# Patient Record
Sex: Male | Born: 1985 | Race: White | Hispanic: No | Marital: Single | Smoking: Former smoker
Health system: Southern US, Community
[De-identification: ages and names within clinical notes are randomized; demographics above are authoritative.]

## PROBLEM LIST (undated history)

## (undated) DIAGNOSIS — F32A Depression, unspecified: Secondary | ICD-10-CM

## (undated) DIAGNOSIS — F419 Anxiety disorder, unspecified: Secondary | ICD-10-CM

## (undated) DIAGNOSIS — M1990 Unspecified osteoarthritis, unspecified site: Secondary | ICD-10-CM

## (undated) HISTORY — PX: OTHER SURGICAL HISTORY: SHX169

## (undated) HISTORY — PX: COLONOSCOPY: SHX174

## (undated) HISTORY — DX: Unspecified osteoarthritis, unspecified site: M19.90

## (undated) HISTORY — DX: Anxiety disorder, unspecified: F41.9

## (undated) HISTORY — PX: UPPER GASTROINTESTINAL ENDOSCOPY: SHX188

## (undated) HISTORY — DX: Depression, unspecified: F32.A

---
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
## 2006-06-30 ENCOUNTER — Emergency Department (HOSPITAL_COMMUNITY): Admission: EM | Admit: 2006-06-30 | Discharge: 2006-06-30 | Payer: Self-pay | Admitting: Emergency Medicine

## 2009-09-13 ENCOUNTER — Emergency Department (HOSPITAL_COMMUNITY): Admission: EM | Admit: 2009-09-13 | Discharge: 2009-09-13 | Payer: Self-pay | Admitting: Emergency Medicine

## 2010-06-03 ENCOUNTER — Emergency Department (HOSPITAL_COMMUNITY): Admission: EM | Admit: 2010-06-03 | Discharge: 2010-06-03 | Payer: Self-pay | Admitting: Emergency Medicine

## 2010-07-31 ENCOUNTER — Ambulatory Visit (HOSPITAL_COMMUNITY): Admission: RE | Admit: 2010-07-31 | Discharge: 2010-07-31 | Payer: Self-pay | Admitting: Orthopaedic Surgery

## 2011-05-24 ENCOUNTER — Other Ambulatory Visit (HOSPITAL_COMMUNITY): Payer: Self-pay | Admitting: Family Medicine

## 2011-05-24 DIAGNOSIS — R1011 Right upper quadrant pain: Secondary | ICD-10-CM

## 2011-05-25 ENCOUNTER — Ambulatory Visit (HOSPITAL_COMMUNITY)
Admission: RE | Admit: 2011-05-25 | Discharge: 2011-05-25 | Disposition: A | Discharge status: Home / Self Care | Payer: Self-pay | Source: Ambulatory Visit | Attending: Family Medicine | Admitting: Family Medicine

## 2011-05-25 DIAGNOSIS — R1011 Right upper quadrant pain: Secondary | ICD-10-CM | POA: Insufficient documentation

## 2011-11-19 ENCOUNTER — Emergency Department (HOSPITAL_COMMUNITY): Payer: Self-pay

## 2011-11-19 ENCOUNTER — Encounter (HOSPITAL_COMMUNITY): Payer: Self-pay

## 2011-11-19 ENCOUNTER — Emergency Department (HOSPITAL_COMMUNITY)
Admission: EM | Admit: 2011-11-19 | Discharge: 2011-11-19 | Disposition: A | Discharge status: Home / Self Care | Payer: Self-pay | Attending: Emergency Medicine | Admitting: Emergency Medicine

## 2011-11-19 DIAGNOSIS — J069 Acute upper respiratory infection, unspecified: Secondary | ICD-10-CM | POA: Insufficient documentation

## 2011-11-19 DIAGNOSIS — Z7982 Long term (current) use of aspirin: Secondary | ICD-10-CM | POA: Insufficient documentation

## 2011-11-19 DIAGNOSIS — F17200 Nicotine dependence, unspecified, uncomplicated: Secondary | ICD-10-CM | POA: Insufficient documentation

## 2011-11-19 MED ORDER — PROMETHAZINE-CODEINE 6.25-10 MG/5ML PO SYRP: 5.0000 mL | ORAL_SOLUTION | ORAL | Status: AC | PRN

## 2011-11-19 MED ORDER — PROMETHAZINE-CODEINE 6.25-10 MG/5ML PO SYRP
ORAL_SOLUTION | ORAL | Status: DC
Start: 2011-11-19 — End: 2012-12-10

## 2011-11-19 MED ORDER — PSEUDOEPHEDRINE HCL 60 MG PO TABS: 60.0000 mg | ORAL_TABLET | ORAL | Status: AC | PRN

## 2011-11-19 NOTE — ED Provider Notes (Signed)
History     CSN: [id number]  Arrival date & time 11/19/11  1538   First MD Initiated Contact with Patient 11/19/11 1658      Chief Complaint  Patient presents with  . URI    (Consider location/radiation/quality/duration/timing/severity/associated sxs/prior treatment) Patient is a 26 y.o. male presenting with cough. The history is provided by the patient.  Cough This is a new problem. The current episode started yesterday. The problem occurs hourly. The problem has been gradually worsening. The cough is productive of sputum. There has been no fever. Associated symptoms include chills, ear congestion, ear pain, headaches, rhinorrhea, sore throat and myalgias. Pertinent negatives include no chest pain, no shortness of breath and no wheezing. He has tried decongestants for the symptoms. The treatment provided no relief. He is a smoker. His past medical history is significant for bronchitis.    History reviewed. No pertinent past medical history.  History reviewed. No pertinent past surgical history.  No family history on file.  History  Substance Use Topics  . Smoking status: Current Everyday Smoker  . Smokeless tobacco: Not on file  . Alcohol Use: No      Review of Systems  Constitutional: Positive for chills. Negative for activity change.       All ROS Neg except as noted in HPI  HENT: Positive for ear pain, sore throat and rhinorrhea. Negative for nosebleeds and neck pain.   Eyes: Negative for photophobia and discharge.  Respiratory: Positive for cough. Negative for shortness of breath and wheezing.   Cardiovascular: Negative for chest pain and palpitations.  Gastrointestinal: Negative for abdominal pain and blood in stool.  Genitourinary: Negative for dysuria, frequency and hematuria.  Musculoskeletal: Positive for myalgias. Negative for back pain and arthralgias.  Skin: Negative.   Neurological: Positive for headaches. Negative for dizziness, seizures and speech  difficulty.  Psychiatric/Behavioral: Negative for hallucinations and confusion.    Allergies  Review of patient's allergies indicates no known allergies.  Home Medications   Current Outpatient Rx  Name Route Sig Dispense Refill  . ASPIRIN 325 MG PO TABS Oral Take 325 mg by mouth as needed. For pain    . DAYQUIL PO Oral Take 2 capsules by mouth as needed. For cough    . PROMETHAZINE-CODEINE 6.25-10 MG/5ML PO SYRP Oral Take 5 mLs by mouth every 4 (four) hours as needed for cough. 120 mL 0  . PROMETHAZINE-CODEINE 6.25-10 MG/5ML PO SYRP  10ml po at hs 100 mL 0  . PSEUDOEPHEDRINE HCL 60 MG PO TABS Oral Take 1 tablet (60 mg total) by mouth every 4 (four) hours as needed for congestion. 30 tablet 0    BP 139/82  Pulse 99  Temp(Src) 98.5 F (36.9 C) (Oral)  Resp 18  Ht 5\' 11"  (1.803 m)  Wt 175 lb (79.379 kg)  BMI 24.41 kg/m2  SpO2 99%  Physical Exam  Nursing note and vitals reviewed. Constitutional: He is oriented to person, place, and time. He appears well-developed and well-nourished.  Non-toxic appearance.  HENT:  Head: Normocephalic.  Right Ear: Tympanic membrane and external ear normal.  Left Ear: Tympanic membrane and external ear normal.       Nasal congestion.  Eyes: EOM and lids are normal. Pupils are equal, round, and reactive to light.  Neck: Normal range of motion. Neck supple. Carotid bruit is not present.  Cardiovascular: Normal rate, regular rhythm, normal heart sounds, intact distal pulses and normal pulses.   Pulmonary/Chest: Breath sounds normal. No respiratory distress.  Few scattered rhonchi.  Abdominal: Soft. Bowel sounds are normal. There is no tenderness. There is no guarding.  Musculoskeletal: Normal range of motion.  Lymphadenopathy:       Head (right side): No submandibular adenopathy present.       Head (left side): No submandibular adenopathy present.    He has no cervical adenopathy.  Neurological: He is alert and oriented to person, place, and  time. He has normal strength. No cranial nerve deficit or sensory deficit.  Skin: Skin is warm and dry. No rash noted.  Psychiatric: He has a normal mood and affect. His speech is normal.    ED Course  Procedures (including critical care time)  Labs Reviewed - No data to display Dg Chest 2 View  11/19/2011  *RADIOLOGY REPORT*  Clinical Data: Congestion.  Cough.  Chest pressure.  CHEST - 2 VIEW  Comparison: 01/18/2005.  Findings:  Cardiopericardial silhouette within normal limits. Mediastinal contours normal. Trachea midline.  No airspace disease or effusion.  IMPRESSION: No active cardiopulmonary disease.  Original Report Authenticated By: Andreas Newport, M.D.     1. URI (upper respiratory infection)       MDM  I have reviewed nursing notes, vital signs, and all appropriate lab and imaging results for this patient.        Kathie Dike, [place] 11/21/11 2107

## 2011-11-19 NOTE — ED Notes (Signed)
Exam by Mayme Genta before seen by me.

## 2011-11-19 NOTE — ED Notes (Signed)
Pt c/o productive cough with white sputum, generalized body aches, chest pressure,  Ears draining, and nose running since yesterday.  Denies fever.

## 2011-11-21 NOTE — ED Provider Notes (Signed)
Medical screening examination/treatment/procedure(s) were performed by non-physician practitioner and as supervising physician I was immediately available for consultation/collaboration.  Crandall Harvel, MD 11/21/11 2232 

## 2012-01-12 ENCOUNTER — Encounter (HOSPITAL_COMMUNITY): Payer: Self-pay | Admitting: *Deleted

## 2012-01-12 ENCOUNTER — Emergency Department (HOSPITAL_COMMUNITY): Payer: Self-pay

## 2012-01-12 ENCOUNTER — Emergency Department (HOSPITAL_COMMUNITY)
Admission: EM | Admit: 2012-01-12 | Discharge: 2012-01-12 | Disposition: A | Discharge status: Home / Self Care | Payer: Self-pay | Attending: Emergency Medicine | Admitting: Emergency Medicine

## 2012-01-12 DIAGNOSIS — M25529 Pain in unspecified elbow: Secondary | ICD-10-CM | POA: Insufficient documentation

## 2012-01-12 DIAGNOSIS — S5010XA Contusion of unspecified forearm, initial encounter: Secondary | ICD-10-CM | POA: Insufficient documentation

## 2012-01-12 DIAGNOSIS — M7989 Other specified soft tissue disorders: Secondary | ICD-10-CM | POA: Insufficient documentation

## 2012-01-12 DIAGNOSIS — M25429 Effusion, unspecified elbow: Secondary | ICD-10-CM | POA: Insufficient documentation

## 2012-01-12 DIAGNOSIS — IMO0002 Reserved for concepts with insufficient information to code with codable children: Secondary | ICD-10-CM | POA: Insufficient documentation

## 2012-01-12 DIAGNOSIS — S53401A Unspecified sprain of right elbow, initial encounter: Secondary | ICD-10-CM

## 2012-01-12 DIAGNOSIS — F17200 Nicotine dependence, unspecified, uncomplicated: Secondary | ICD-10-CM | POA: Insufficient documentation

## 2012-01-12 DIAGNOSIS — Z79899 Other long term (current) drug therapy: Secondary | ICD-10-CM | POA: Insufficient documentation

## 2012-01-12 DIAGNOSIS — S5011XA Contusion of right forearm, initial encounter: Secondary | ICD-10-CM

## 2012-01-12 DIAGNOSIS — W010XXA Fall on same level from slipping, tripping and stumbling without subsequent striking against object, initial encounter: Secondary | ICD-10-CM | POA: Insufficient documentation

## 2012-01-12 MED ORDER — IBUPROFEN 800 MG PO TABS
800.0000 mg | ORAL_TABLET | Freq: Once | ORAL | Status: AC
  Administered 2012-01-12: 800 mg via ORAL
  Filled 2012-01-12: qty 1

## 2012-01-12 MED ORDER — HYDROCODONE-ACETAMINOPHEN 5-325 MG PO TABS: 2.0000 | ORAL_TABLET | ORAL | Status: AC | PRN

## 2012-01-12 MED ORDER — IBUPROFEN 800 MG PO TABS: 800.0000 mg | ORAL_TABLET | Freq: Once | ORAL | Status: AC

## 2012-01-12 MED ORDER — HYDROCODONE-ACETAMINOPHEN 5-325 MG PO TABS
2.0000 | ORAL_TABLET | Freq: Once | ORAL | Status: AC
  Administered 2012-01-12: 2 via ORAL
  Filled 2012-01-12: qty 2

## 2012-01-12 NOTE — ED Notes (Signed)
Pt reports falling off porch and landing on right arm last night.  Reports pain and edema has increased overnight.  Pt able to open and close hand however reports this increases pain.  Denies numbness or tingling.  Radial pulses palpable.

## 2012-01-12 NOTE — Discharge Instructions (Signed)
Bone Bruise  A bone bruise is a small hidden fracture of the bone. It typically occurs with bones located close to the surface of the skin.  SYMPTOMS  The pain lasts longer than a normal bruise.   The bruised area is difficult to use.   There may be discoloration or swelling of the bruised area.   When a bone bruise is found with injury to the anterior cruciate ligament (in the knee) there is often an increased:   Amount of fluid in the knee   Time the fluid in the knee lasts.   Number of days until you are walking normally and regaining the motion you had before the injury.   Number of days with pain from the injury.  DIAGNOSIS  It can only be seen on X-rays known as MRIs. This stands for magnetic resonance imaging. A regular X-ray taken of a bone bruise would appear to be normal. A bone bruise is a common injury in the knee and the heel bone (calcaneus). The problems are similar to those produced by stress fractures, which are bone injuries caused by overuse. A bone bruise may also be a sign of other injuries. For example, bone bruises are commonly found where an anterior cruciate ligament (ACL) in the knee has been pulled away from the bone (ruptured). A ligament is a tough fibrous material that connects bones together to make our joints stable. Bruises of the bone last a lot longer than bruises of the muscle or tissues beneath the skin. Bone bruises can last from days to months and are often more severe and painful than other bruises. TREATMENT Because bone bruises are sudden injuries you cannot often prevent them, other than by being extremely careful. Some things you can do to improve the condition are:  Apply ice to the sore area for 15 to 20 minutes, 3 to 4 times per day while awake for the first 2 days. Put the ice in a plastic bag, and place a towel between the bag of ice and your skin.   Keep your bruised area raised (elevated) when possible to lessen swelling.   For activity:     Use crutches when necessary; do not put weight on the injured leg until you are no longer tender.   You may walk on your affected part as the pain allows, or as instructed.   Start weight bearing gradually on the bruised part.   Continue to use crutches or a cane until you can stand without causing pain, or as instructed.   If a plaster splint was applied, wear the splint until you are seen for a follow-up examination. Rest it on nothing harder than a pillow the first 24 hours. Do not put weight on it. Do not get it wet. You may take it off to take a shower or bath.   If an air splint was applied, more air may be blown into or out of the splint as needed for comfort. You may take it off at night and to take a shower or bath.   Wiggle your toes in the splint several times per day if you are able.   You may have been given an elastic bandage to use with the plaster splint or alone. The splint is too tight if you have numbness, tingling or if your foot becomes cold and blue. Adjust the bandage to make it comfortable.   Only take over-the-counter or prescription medicines for pain, discomfort, or fever as directed by   your caregiver.   Follow all instructions for follow up with your caregiver. This includes any orthopedic referrals, physical therapy, and rehabilitation. Any delay in obtaining necessary care could result in a delay or failure of the bones to heal.  SEEK MEDICAL CARE IF:   You have an increase in bruising, swelling, or pain.   You notice coldness of your toes.   You do not get pain relief with medications.  SEEK IMMEDIATE MEDICAL CARE IF:   Your toes are numb or blue.   You have severe pain not controlled with medications.   If any of the problems that caused you to seek care are becoming worse.  Document Released: 12/29/2003 Document Revised: 09/27/2011 Document Reviewed: 05/12/2008 [facility] Patient Information 2012 [place], [place].Contusion A contusion is a deep  bruise. Contusions are the result of an injury that caused bleeding under the skin. The contusion may turn blue, purple, or yellow. Minor injuries will give you a painless contusion, but more severe contusions may stay painful and swollen for a few weeks.  CAUSES  A contusion is usually caused by a blow, trauma, or direct force to an area of the body. SYMPTOMS   Swelling and redness of the injured area.   Bruising of the injured area.   Tenderness and soreness of the injured area.   Pain.  DIAGNOSIS  The diagnosis can be made by taking a history and physical exam. An X-ray, CT scan, or MRI may be needed to determine if there were any associated injuries, such as fractures. TREATMENT  Specific treatment will depend on what area of the body was injured. In general, the best treatment for a contusion is resting, icing, elevating, and applying cold compresses to the injured area. Over-the-counter medicines may also be recommended for pain control. Ask your caregiver what the best treatment is for your contusion. HOME CARE INSTRUCTIONS   Put ice on the injured area.   Put ice in a plastic bag.   Place a towel between your skin and the bag.   Leave the ice on for 15 to 20 minutes, 3 to 4 times a day.   Only take over-the-counter or prescription medicines for pain, discomfort, or fever as directed by your caregiver. Your caregiver may recommend avoiding anti-inflammatory medicines (aspirin, ibuprofen, and naproxen) for 48 hours because these medicines may increase bruising.   Rest the injured area.   If possible, elevate the injured area to reduce swelling.  SEEK IMMEDIATE MEDICAL CARE IF:   You have increased bruising or swelling.   You have pain that is getting worse.   Your swelling or pain is not relieved with medicines.  MAKE SURE YOU:   Understand these instructions.   Will watch your condition.   Will get help right away if you are not doing well or get worse.  Document  Released: 07/18/2005 Document Revised: 09/27/2011 Document Reviewed: 08/13/2011 [facility] Patient Information 2012 [place], [place].Cryotherapy Cryotherapy means treatment with cold. Ice or gel packs can be used to reduce both pain and swelling. Ice is the most helpful within the first 24 to 48 hours after an injury or flareup from overusing a muscle or joint. Sprains, strains, spasms, burning pain, shooting pain, and aches can all be eased with ice. Ice can also be used when recovering from surgery. Ice is effective, has very few side effects, and is safe for most people to use. PRECAUTIONS  Ice is not a safe treatment option for people with:  Raynaud's phenomenon. This is  a condition affecting small blood vessels in the extremities. Exposure to cold may cause your problems to return.   Cold hypersensitivity. There are many forms of cold hypersensitivity, including:   Cold urticaria. Red, itchy hives appear on the skin when the tissues begin to warm after being iced.   Cold erythema. This is a red, itchy rash caused by exposure to cold.   Cold hemoglobinuria. Red blood cells break down when the tissues begin to warm after being iced. The hemoglobin that carry oxygen are passed into the urine because they cannot combine with blood proteins fast enough.   Numbness or altered sensitivity in the area being iced.  If you have any of the following conditions, do not use ice until you have discussed cryotherapy with your caregiver:  Heart conditions, such as arrhythmia, angina, or chronic heart disease.   High blood pressure.   Healing wounds or open skin in the area being iced.   Current infections.   Rheumatoid arthritis.   Poor circulation.   Diabetes.  Ice slows the blood flow in the region it is applied. This is beneficial when trying to stop inflamed tissues from spreading irritating chemicals to surrounding tissues. However, if you expose your skin to cold temperatures for too long or  without the proper protection, you can damage your skin or nerves. Watch for signs of skin damage due to cold. HOME CARE INSTRUCTIONS Follow these tips to use ice and cold packs safely.  Place a dry or damp towel between the ice and skin. A damp towel will cool the skin more quickly, so you may need to shorten the time that the ice is used.   For a more rapid response, add gentle compression to the ice.   Ice for no more than 10 to 20 minutes at a time. The bonier the area you are icing, the less time it will take to get the benefits of ice.   Check your skin after 5 minutes to make sure there are no signs of a poor response to cold or skin damage.   Rest 20 minutes or more in between uses.   Once your skin is numb, you can end your treatment. You can test numbness by very lightly touching your skin. The touch should be so light that you do not see the skin dimple from the pressure of your fingertip. When using ice, most people will feel these normal sensations in this order: cold, burning, aching, and numbness.   Do not use ice on someone who cannot communicate their responses to pain, such as small children or people with dementia.  HOW TO MAKE AN ICE PACK Ice packs are the most common way to use ice therapy. Other methods include ice massage, ice baths, and cryo-sprays. Muscle creams that cause a cold, tingly feeling do not offer the same benefits that ice offers and should not be used as a substitute unless recommended by your caregiver. To make an ice pack, do one of the following:  Place crushed ice or a bag of frozen vegetables in a sealable plastic bag. Squeeze out the excess air. Place this bag inside another plastic bag. Slide the bag into a pillowcase or place a damp towel between your skin and the bag.   Mix 3 parts water with 1 part rubbing alcohol. Freeze the mixture in a sealable plastic bag. When you remove the mixture from the freezer, it will be slushy. Squeeze out the excess  air. Place this bag  inside another plastic bag. Slide the bag into a pillowcase or place a damp towel between your skin and the bag.  SEEK MEDICAL CARE IF:  You develop white spots on your skin. This may give the skin a blotchy (mottled) appearance.   Your skin turns blue or pale.   Your skin becomes waxy or hard.   Your swelling gets worse.  MAKE SURE YOU:   Understand these instructions.   Will watch your condition.   Will get help right away if you are not doing well or get worse.  Document Released: 06/04/2011 Document Revised: 09/27/2011 Document Reviewed: 06/04/2011 [facility] Patient Information 2012 [place], [place].Elbow Injury You or your child has an elbow injury. X-rays and exam today do not show evidence of a fracture (broken bone). That means that only a sling or splint may be required for a brief period of time as directed by your caregiver. HOME CARE INSTRUCTIONS  Only take over-the-counter or prescription medicines for pain, discomfort, or fever as directed by your caregiver.   If you have a splint held on with an elastic wrap or a cast, watch your hand or fingers. If they become numb or cold and blue, loosen the wrap and reapply more loosely. See your caregiver if there is no relief.   You may use ice on your elbow for 15 to 20 minutes, 3 to 4 times per day, for the first 2 to 3 days.   Use your elbow as directed.   See your caregiver as directed. It is very important to keep all follow-up referrals and appointments in order to avoid any long-term problems with your elbow including chronic pain or inability to move the elbow normally.  SEEK IMMEDIATE MEDICAL CARE IF:   There is swelling or increasing pain in your elbow which is not relieved with medications.   You begin to lose feeling in your hand or fingers, or develop swelling of the hand and fingers.   You get a cold or blue hand or fingers on injured side.   If your elbow remains sore, your caregiver may  want to x-ray it again. A hairline fracture may not show up on the first x-rays and may only be seen on repeat x-rays ten days to two weeks later. A specialist (radiologist) may examine your x-rays at a later time. In order to get results from the radiologist or their department, make sure you know how and when you are to get that information. It is your responsibility to get results of any tests you may have had.  MAKE SURE YOU:   Understand these instructions.   Will watch your condition.   Will get help right away if you are not doing well or get worse.  Document Released: 12/29/2003 Document Revised: 09/27/2011 Document Reviewed: 05/26/2008 [facility] Patient Information 2012 [place], [place].

## 2012-01-12 NOTE — ED Provider Notes (Signed)
History     CSN: [id number]  Arrival date & time 01/12/12  0532   First MD Initiated Contact with Patient 01/12/12 [id number]      Chief Complaint  Patient presents with  . Arm Injury    (Consider location/radiation/quality/duration/timing/severity/associated sxs/prior treatment) HPI Comments: The patient had a fall onto right upper extremity last night around midnight before going to bed. He landed on his right forearm/elbow, with subsequent pain and swelling at the right proximal radial head and dorsal proximal forearm of the right upper extremity. He went to bed thinking that maybe he only bruised or sprained it, and woke up this morning with increased pain and swelling with some ecchymosis. He is thus concerned that he may have sustained a fracture. He denies any pain or tenderness at the wrist or hand, and has full movement of extension and flexion through the wrist and hand with intact sensation of the fingers and a full palpable radial pulse distally. He denies any other injuries.  Patient is a 26 y.o. male presenting with arm injury. The history is provided by the patient.  Arm Injury  Episode onset: Around midnight, approximately 6 hours ago. The incident occurred at home. The injury mechanism was a fall. Context: The patient was letting his dog out into the yard and tripped and fell forward onto his right upper extremity. The wounds were not self-inflicted. The pain is moderate. It is unlikely that a foreign body is present. Pertinent negatives include no chest pain, no numbness, no headaches, no neck pain and no difficulty breathing. There have been no prior injuries to these areas. He is right-handed. He has been behaving normally.    History reviewed. No pertinent past medical history.  History reviewed. No pertinent past surgical history.  History reviewed. No pertinent family history.  History  Substance Use Topics  . Smoking status: Current Everyday Smoker  . Smokeless  tobacco: Not on file  . Alcohol Use: No     occasional      Review of Systems  HENT: Negative for neck pain.   Cardiovascular: Negative for chest pain.  Neurological: Negative for numbness and headaches.  All other systems reviewed and are negative.    Allergies  Review of patient's allergies indicates no known allergies.  Home Medications   Current Outpatient Rx  Name Route Sig Dispense Refill  . ASPIRIN 325 MG PO TABS Oral Take 325 mg by mouth as needed. For pain    . PROMETHAZINE-CODEINE 6.25-10 MG/5ML PO SYRP  10ml po at hs 100 mL 0  . DAYQUIL PO Oral Take 2 capsules by mouth as needed. For cough      BP 131/74  Pulse 87  Temp(Src) 99.4 F (37.4 C) (Oral)  Resp 18  Ht 5\' 11"  (1.803 m)  Wt 175 lb (79.379 kg)  BMI 24.41 kg/m2  SpO2 98%  Physical Exam  Nursing note and vitals reviewed. Constitutional: He is oriented to person, place, and time. He appears well-developed and well-nourished. No distress.  HENT:  Head: Normocephalic and atraumatic.  Eyes: EOM are normal. Pupils are equal, round, and reactive to light.  Neck: Normal range of motion. Neck supple.  Cardiovascular: Normal rate and regular rhythm.   Pulmonary/Chest: Effort normal.  Musculoskeletal: He exhibits edema and tenderness.       Right elbow: He exhibits decreased range of motion, swelling and deformity. He exhibits no laceration. tenderness found. Radial head tenderness noted. No medial epicondyle, no lateral epicondyle and no olecranon process  tenderness noted.       Arms: Neurological: He is alert and oriented to person, place, and time. He has normal reflexes. He exhibits normal muscle tone.  Skin: Skin is warm and dry. No rash noted. No erythema. No pallor.  Psychiatric: He has a normal mood and affect. His behavior is normal.    ED Course  Procedures (including critical care time)  Labs Reviewed - No data to display No results found.   No diagnosis found.    MDM  The patient's  mechanism of injury and physical examination are worrisome for possible fracture of the right forearm or elbow. I will valuate further with x-rays.       Felisa Bonier, MD 01/12/12 [id number]

## 2012-05-09 ENCOUNTER — Emergency Department (HOSPITAL_COMMUNITY)
Admission: EM | Admit: 2012-05-09 | Discharge: 2012-05-09 | Disposition: A | Discharge status: Home / Self Care | Payer: Self-pay | Attending: Emergency Medicine | Admitting: Emergency Medicine

## 2012-05-09 ENCOUNTER — Encounter (HOSPITAL_COMMUNITY): Payer: Self-pay | Admitting: *Deleted

## 2012-05-09 ENCOUNTER — Emergency Department (HOSPITAL_COMMUNITY): Payer: Self-pay

## 2012-05-09 DIAGNOSIS — W269XXA Contact with unspecified sharp object(s), initial encounter: Secondary | ICD-10-CM | POA: Insufficient documentation

## 2012-05-09 DIAGNOSIS — Z87891 Personal history of nicotine dependence: Secondary | ICD-10-CM | POA: Insufficient documentation

## 2012-05-09 DIAGNOSIS — Z23 Encounter for immunization: Secondary | ICD-10-CM | POA: Insufficient documentation

## 2012-05-09 DIAGNOSIS — S91109A Unspecified open wound of unspecified toe(s) without damage to nail, initial encounter: Secondary | ICD-10-CM | POA: Insufficient documentation

## 2012-05-09 DIAGNOSIS — S91309A Unspecified open wound, unspecified foot, initial encounter: Secondary | ICD-10-CM | POA: Insufficient documentation

## 2012-05-09 DIAGNOSIS — S81009A Unspecified open wound, unspecified knee, initial encounter: Secondary | ICD-10-CM | POA: Insufficient documentation

## 2012-05-09 DIAGNOSIS — T07XXXA Unspecified multiple injuries, initial encounter: Secondary | ICD-10-CM

## 2012-05-09 MED ORDER — LIDOCAINE-EPINEPHRINE (PF) 1 %-1:200000 IJ SOLN
20.0000 mL | Freq: Once | INTRAMUSCULAR | Status: AC
  Administered 2012-05-09: 20 mL
  Filled 2012-05-09: qty 20

## 2012-05-09 MED ORDER — ONDANSETRON 8 MG PO TBDP
ORAL_TABLET | ORAL | Status: AC
  Filled 2012-05-09: qty 1

## 2012-05-09 MED ORDER — HYDROCODONE-ACETAMINOPHEN 5-325 MG PO TABS
2.0000 | ORAL_TABLET | Freq: Once | ORAL | Status: AC
  Administered 2012-05-09: 2 via ORAL
  Filled 2012-05-09: qty 2

## 2012-05-09 MED ORDER — HYDROCODONE-ACETAMINOPHEN 5-325 MG PO TABS: 1.0000 | ORAL_TABLET | ORAL | Status: AC | PRN

## 2012-05-09 MED ORDER — BACITRACIN 500 UNIT/GM EX OINT
1.0000 "application " | TOPICAL_OINTMENT | Freq: Once | CUTANEOUS | Status: AC
  Administered 2012-05-09: 1 via TOPICAL
  Filled 2012-05-09: qty 0.9

## 2012-05-09 MED ORDER — TETANUS-DIPHTH-ACELL PERTUSSIS 5-2.5-18.5 LF-MCG/0.5 IM SUSP
0.5000 mL | Freq: Once | INTRAMUSCULAR | Status: AC
  Administered 2012-05-09: 0.5 mL via INTRAMUSCULAR
  Filled 2012-05-09: qty 0.5

## 2012-05-09 MED ORDER — ONDANSETRON 8 MG PO TBDP
8.0000 mg | ORAL_TABLET | Freq: Once | ORAL | Status: AC
  Administered 2012-05-09: 06:00:00 via ORAL

## 2012-05-09 NOTE — ED Provider Notes (Signed)
History     CSN: [id number]  Arrival date & time 05/09/12  0343   First MD Initiated Contact with Patient 05/09/12 0401      Chief Complaint  Patient presents with  . Laceration    right foot    (Consider location/radiation/quality/duration/timing/severity/associated sxs/prior treatment) HPI Darryl Reed is a 26 y.o. male brought in by ambulance, who presents to the Emergency Department complaining of laceration to right foot sustained on an unknown object while walking in his yard barefoot. Laceration to top of foot and extends across the second and third toe. Bleeding controlled with pressure bandage.   History reviewed. No pertinent past medical history.  History reviewed. No pertinent past surgical history.  History reviewed. No pertinent family history.  History  Substance Use Topics  . Smoking status: Former Smoker    Types: Cigarettes  . Smokeless tobacco: Not on file  . Alcohol Use: Yes     occasional      Review of Systems  Constitutional: Negative for fever.       10 Systems reviewed and are negative for acute change except as noted in the HPI.  HENT: Negative for congestion.   Eyes: Negative for discharge and redness.  Respiratory: Negative for cough and shortness of breath.   Cardiovascular: Negative for chest pain.  Gastrointestinal: Negative for vomiting and abdominal pain.  Musculoskeletal: Negative for back pain.  Skin: Negative for rash.       Laceration to right foot  Neurological: Negative for syncope, numbness and headaches.  Psychiatric/Behavioral:       No behavior change.    Allergies  Review of patient's allergies indicates no known allergies.  Home Medications   Current Outpatient Rx  Name Route Sig Dispense Refill  . ASPIRIN 325 MG PO TABS Oral Take 325 mg by mouth as needed. For pain    . PROMETHAZINE-CODEINE 6.25-10 MG/5ML PO SYRP  10ml po at hs 100 mL 0  . DAYQUIL PO Oral Take 2 capsules by mouth as needed. For cough       BP 113/67  Pulse 118  Temp 98.3 F (36.8 C)  Resp 16  Ht 5\' 11"  (1.803 m)  Wt 180 lb (81.647 kg)  BMI 25.10 kg/m2  SpO2 98%  Physical Exam  Nursing note and vitals reviewed. Constitutional: He is oriented to person, place, and time. He appears well-developed and well-nourished.       Awake, alert, nontoxic appearance.  HENT:  Head: Atraumatic.  Eyes: Conjunctivae and EOM are normal. Pupils are equal, round, and reactive to light. Right eye exhibits no discharge. Left eye exhibits no discharge.  Neck: Normal range of motion. Neck supple.  Cardiovascular: Normal heart sounds.   Pulmonary/Chest: Effort normal and breath sounds normal. He exhibits no tenderness.  Abdominal: Soft. There is no tenderness. There is no rebound.  Musculoskeletal: He exhibits no tenderness.       Feet:       Baseline ROM, no obvious new focal weakness.Large flap laceration that begins on dorsum of right foot and extends down the length of the second toe 19 cm with a small 2 cm C shaped laceration adjacent on the third toe. 2 cm laceration to the posterior calf of the right leg. Able to move toes. No tendon involvement. Normal plantar and dorsiflexion of the foot.   Neurological: He is alert and oriented to person, place, and time.       Mental status and motor strength appears baseline for patient  and situation.  Skin: No rash noted.  Psychiatric: He has a normal mood and affect.    ED Course  Procedures (including critical care time)  LACERATION REPAIR Performed by: Annamarie Dawley. Authorized by: Annamarie Dawley Consent: Verbal consent obtained. Risks and benefits: risks, benefits and alternatives were discussed Consent given by: patient Patient identity confirmed: provided demographic data Prepped and Draped in normal sterile fashion Wound explored Laceration Location:right foot Laceration Length: 19 cm No Foreign Bodies seen or palpated Anesthesia: local infiltration Local anesthetic:  lidocaine 2% w epinephrine Anesthetic total: 12  ml Irrigation method: syringe Amount of cleaning: extensive Skin closure: sutures Number of sutures: 10 Technique: simple interuppted Patient tolerance: Patient tolerated the procedure well with no immediate complications.   LACERATION REPAIR Performed by: Annamarie Dawley Authorized by: Annamarie Dawley Consent: Verbal consent obtained. Risks and benefits: risks, benefits and alternatives were discussed Consent given by: patient Patient identity confirmed: provided demographic data Prepped and Draped in normal sterile fashion Wound explored Laceration Location:third toe right foot Laceration Length: 2 cm No Foreign Bodies seen or palpated Anesthesia: local infiltration Local anesthetic: lidocaine 2% w  epinephrine Anesthetic total: 1 ml Irrigation method: syringe Amount of cleaning: extensive Skin closure:sutures Number of sutures: 2 Technique: Simple interuppted Patient tolerance: Patient tolerated the procedure well with no immediate complications.   LACERATION REPAIR Performed by: Annamarie Dawley Authorized by: Annamarie Dawley Consent: Verbal consent obtained. Risks and benefits: risks, benefits and alternatives were discussed Consent given by: patient Patient identity confirmed: provided demographic data Prepped and Draped in normal sterile fashion Wound explored Laceration Location: posterior left calf Laceration Length: 2 cm No Foreign Bodies seen or palpated Irrigation method: syringe Amount of cleaning: standard Skin closure:staples Number of sutures: 1  Patient tolerance: Patient tolerated the procedure well with no immediate complications. Dg Foot Complete Left  05/09/2012  *RADIOLOGY REPORT*  Clinical Data: Laceration on the dorsum of right foot  LEFT FOOT - COMPLETE 3+ VIEW  Comparison: None.  Findings: AP, oblique and lateral views of the right foot demonstrate no evidence of acute fracture, or other  osseous abnormality.  On the lateral view, there is mild soft tissue swelling and subcutaneous emphysema over the MTP joints consistent with the clinical history of laceration.  No radiopaque foreign body is identified. Osseous mineralization appears unremarkable.  IMPRESSION: Soft tissue injury overlying the dorsum of the foot at the MTP joint consistent with the clinical history of laceration  No evidence of underlying bony injury, or radiopaque foreign body.  Original Report Authenticated By: Vilma Prader   MDM  Patient with complicated laceration to the right foot involving dorsum of foot, second and third toes. Repair completed. Tetanus update. Xray negative for bony injury.Given antiinflammatory and analgesic. Pt stable in ED with no significant deterioration in condition.The patient appears reasonably screened and/or stabilized for discharge and I doubt any other medical condition or other [facility] requiring further screening, evaluation, or treatment in the ED at this time prior to discharge.  MDM Reviewed: nursing note and vitals Interpretation: x-ray           Nicoletta Dress. Colon Branch, MD 05/09/12 1851

## 2012-05-09 NOTE — ED Notes (Signed)
Pt brought in via ems for laceration to right foot.  Parents were attempting to bring him and feared running out of gas and called EMS.

## 2012-06-12 DIAGNOSIS — R002 Palpitations: Secondary | ICD-10-CM

## 2012-12-10 ENCOUNTER — Encounter (HOSPITAL_COMMUNITY): Payer: Self-pay

## 2012-12-10 ENCOUNTER — Emergency Department (HOSPITAL_COMMUNITY): Payer: 59

## 2012-12-10 ENCOUNTER — Emergency Department (HOSPITAL_COMMUNITY)
Admission: EM | Admit: 2012-12-10 | Discharge: 2012-12-10 | Disposition: A | Discharge status: Home / Self Care | Payer: 59 | Attending: Emergency Medicine | Admitting: Emergency Medicine

## 2012-12-10 DIAGNOSIS — Y929 Unspecified place or not applicable: Secondary | ICD-10-CM | POA: Insufficient documentation

## 2012-12-10 DIAGNOSIS — S5010XA Contusion of unspecified forearm, initial encounter: Secondary | ICD-10-CM

## 2012-12-10 DIAGNOSIS — Y9301 Activity, walking, marching and hiking: Secondary | ICD-10-CM | POA: Insufficient documentation

## 2012-12-10 DIAGNOSIS — Z87891 Personal history of nicotine dependence: Secondary | ICD-10-CM | POA: Insufficient documentation

## 2012-12-10 DIAGNOSIS — W010XXA Fall on same level from slipping, tripping and stumbling without subsequent striking against object, initial encounter: Secondary | ICD-10-CM | POA: Insufficient documentation

## 2012-12-10 MED ORDER — NAPROXEN 500 MG PO TABS: 500.0000 mg | ORAL_TABLET | Freq: Two times a day (BID) | ORAL | Status: DC

## 2012-12-10 NOTE — ED Provider Notes (Signed)
History    This chart was scribed for Darryl Jakes, MD by Sofie Rower, ED Scribe. The patient was seen in room APA12/APA12 and the patient's care was started at 7:37AM.      CSN: [id number]  Arrival date & time 12/10/12  0715   First MD Initiated Contact with Patient 12/10/12 [phone]      Chief Complaint  Patient presents with  . Arm Pain    (Consider location/radiation/quality/duration/timing/severity/associated sxs/prior treatment) Patient is a 27 y.o. male presenting with arm injury. The history is provided by the patient. No language interpreter was used.  Arm Injury Location:  Arm Time since incident:  2 days Injury: yes   Mechanism of injury: fall   Fall:    Fall occurred:  Walking   Impact surface:  [employer] of impact:  Outstretched arms   Entrapped after fall: no   Arm location:  L arm Pain details:    Quality:  Aching   Radiates to:  Does not radiate   Severity:  Moderate   Onset quality:  Sudden   Duration:  2 days   Timing:  Constant   Progression:  Worsening Chronicity:  New Handedness:  Right-handed Dislocation: no   Foreign body present:  No foreign bodies Tetanus status:  Unknown Prior injury to area:  No Relieved by:  Nothing Worsened by:  Movement Ineffective treatments:  None tried Associated symptoms: no back pain, no fever and no neck pain     Darryl Reed is a 27 y.o. male , who presents to the Emergency Department complaining of   sudden, progressively worsening, arm pain located at the left, proximal forearm, onset two days ago (12/08/12).  Associated symptoms include ecchymosis located at the left elbow. The pt reports he was walking outside on Monday, 12/08/12, where he suddenly slipped and fell, impacting directly upon his left proximal forearm, onto a sheet of ice. The pt informs he is right hand dominant. The pt has not taken any OTC pain medications to relieve his arm pain at present time. Modifying factors include certain movements  and positions of the left forearm which intensifies the arm pain.   The pt denies headache, neck pain, back pain, chest pain, nausea, vomiting, dysuria, fevers, chills, and rash. Furthremore, the pt denies any hx of bleeding easily.   The pt is a former smoker and drinks alcohol on occasion.   PCP is Dr. Gerda Diss.    History reviewed. No pertinent past medical history.  History reviewed. No pertinent past surgical history.  No family history on file.  History  Substance Use Topics  . Smoking status: Former Smoker    Types: Cigarettes  . Smokeless tobacco: Not on file  . Alcohol Use: Yes     Comment: occasional      Review of Systems  Constitutional: Negative for fever and chills.  HENT: Negative for neck pain.   Cardiovascular: Negative for chest pain.  Gastrointestinal: Negative for nausea and vomiting.  Genitourinary: Negative for dysuria.  Musculoskeletal: Positive for arthralgias. Negative for back pain.  Skin: Negative for rash.  Neurological: Negative for headaches.  Hematological: Does not bruise/bleed easily.  All other systems reviewed and are negative.    Allergies  Review of patient's allergies indicates no known allergies.  Home Medications   Current Outpatient Rx  Name  Route  Sig  Dispense  Refill  . aspirin 325 MG tablet   Oral   Take 325 mg by mouth as needed.  For pain         . naproxen (NAPROSYN) 500 MG tablet   Oral   Take 1 tablet (500 mg total) by mouth 2 (two) times daily.   14 tablet   0     BP 137/76  Pulse 88  Temp(Src) 97.5 F (36.4 C) (Oral)  Resp 16  Ht 5\' 11"  (1.803 m)  Wt 180 lb (81.647 kg)  BMI 25.12 kg/m2  SpO2 99%  Physical Exam  Nursing note and vitals reviewed. Constitutional: He is oriented to person, place, and time. He appears well-developed and well-nourished. No distress.  HENT:  Head: Normocephalic and atraumatic.  Mouth/Throat: Oropharynx is clear and moist.  Eyes: Conjunctivae and EOM are normal.  Pupils are equal, round, and reactive to light. No scleral icterus.  Neck: Normal range of motion. Neck supple. No tracheal deviation present.  Cardiovascular: Normal rate, regular rhythm and normal heart sounds.  Exam reveals no gallop and no friction rub.   No murmur heard. Pulmonary/Chest: Effort normal and breath sounds normal. No respiratory distress. He has no wheezes.  Abdominal: Soft. Bowel sounds are normal. He exhibits no distension. There is no tenderness.  Musculoskeletal: Normal range of motion. He exhibits tenderness. He exhibits no edema.  Left Proximal 1/3 of left forearm:  Radial pulse 2 +. Cap refill 1 second. Bruise (4 cm) on the back side of left elbow.   Neurological: He is alert and oriented to person, place, and time. No cranial nerve deficit or sensory deficit.  Skin: Skin is warm and dry.  Psychiatric: He has a normal mood and affect. His behavior is normal.    ED Course  Procedures (including critical care time)  DIAGNOSTIC STUDIES: Oxygen Saturation is 99% on room air, normal by my interpretation.    COORDINATION OF CARE:  7:44 AM- Treatment plan concerning x-ray of left forearm discussed with patient. Pt agrees with treatment.    No results found for this or any previous visit. Dg Elbow Complete Left  12/10/2012  *RADIOLOGY REPORT*  Clinical Data: Forearm and elbow pain.  Fall.  LEFT ELBOW - COMPLETE 3+ VIEW  Comparison: None  Findings: No acute bony abnormality.  Specifically, no fracture, subluxation, or dislocation.  Soft tissues are intact. Joint spaces are maintained.  Normal bone mineralization.  No joint effusion.  IMPRESSION: No bony abnormality.   Original Report Authenticated By: Charlett Nose, M.D.    Dg Forearm Left  12/10/2012  *RADIOLOGY REPORT*  Clinical Data: Forearm pain.  LEFT FOREARM - 2 VIEW  Comparison: None  Findings: No acute bony abnormality.  Specifically, no fracture, subluxation, or dislocation.  Soft tissues are intact. Joint spaces  are maintained.  Normal bone mineralization.  IMPRESSION: Normal study.   Original Report Authenticated By: Charlett Nose, M.D.       1. Forearm contusion       MDM   Soft tissue muscle injury to the left forearm. No neuro deficit.good range of motion of the fingers. X-rays negative patient had some bruising around the elbow x-rays at that are also negative.      I personally performed the services described in this documentation, which was scribed in my presence. The recorded information has been reviewed and is accurate.     Darryl Jakes, MD 12/10/12 0900

## 2012-12-10 NOTE — ED Notes (Signed)
Pt was walking outside Monday am when tripped over dog fence and landed on left forearm. C/o pain that is still present from Monday. Worse with movement. No bruising or swelling present. Full rom present. Cap refill present.

## 2013-08-03 ENCOUNTER — Encounter (HOSPITAL_COMMUNITY): Payer: Self-pay | Admitting: Emergency Medicine

## 2013-08-03 ENCOUNTER — Emergency Department (HOSPITAL_COMMUNITY): Payer: 59

## 2013-08-03 ENCOUNTER — Emergency Department (HOSPITAL_COMMUNITY)
Admission: EM | Admit: 2013-08-03 | Discharge: 2013-08-03 | Disposition: A | Discharge status: Home / Self Care | Payer: 59 | Attending: Emergency Medicine | Admitting: Emergency Medicine

## 2013-08-03 DIAGNOSIS — W208XXA Other cause of strike by thrown, projected or falling object, initial encounter: Secondary | ICD-10-CM | POA: Insufficient documentation

## 2013-08-03 DIAGNOSIS — S91309A Unspecified open wound, unspecified foot, initial encounter: Secondary | ICD-10-CM | POA: Insufficient documentation

## 2013-08-03 DIAGNOSIS — Y929 Unspecified place or not applicable: Secondary | ICD-10-CM | POA: Insufficient documentation

## 2013-08-03 DIAGNOSIS — Y939 Activity, unspecified: Secondary | ICD-10-CM | POA: Insufficient documentation

## 2013-08-03 DIAGNOSIS — S91311A Laceration without foreign body, right foot, initial encounter: Secondary | ICD-10-CM

## 2013-08-03 DIAGNOSIS — Z87891 Personal history of nicotine dependence: Secondary | ICD-10-CM | POA: Insufficient documentation

## 2013-08-03 MED ORDER — LIDOCAINE HCL (PF) 1 % IJ SOLN
5.0000 mL | Freq: Once | INTRAMUSCULAR | Status: DC
  Filled 2013-08-03: qty 5

## 2013-08-03 MED ORDER — HYDROCODONE-ACETAMINOPHEN 5-325 MG PO TABS
1.0000 | ORAL_TABLET | Freq: Once | ORAL | Status: AC
  Administered 2013-08-03: 1 via ORAL
  Filled 2013-08-03: qty 1

## 2013-08-03 MED ORDER — KETOROLAC TROMETHAMINE 10 MG PO TABS
10.0000 mg | ORAL_TABLET | Freq: Once | ORAL | Status: AC
  Administered 2013-08-03: 10 mg via ORAL
  Filled 2013-08-03: qty 1

## 2013-08-03 NOTE — ED Provider Notes (Signed)
CSN: [id number]     Arrival date & time 08/03/13  2034 History   First MD Initiated Contact with Patient 08/03/13 2041     This chart was scribed for non-physician practitioner, Ivery Quale PA-C, working with Joya Gaskins, MD by Arlan Organ, ED Scribe. This patient was seen in room APFT24/APFT24 and the patient's care was started at 9:02 PM.   Chief Complaint  Patient presents with  . Extremity Laceration    right foot   The history is provided by the patient. No language interpreter was used.   HPI Comments: Darryl Reed is a 27 y.o. male who presents to the Emergency Department complaining of a laceration that occurred today around 3 PM. Pt rates the pain 6/10, and describes it as throbbing. Pt states he dropped a plate on his foot. He states he attempted to put a Band-Aid on the wound, and proceed with his day. Shortly after, the wound reopened and worsened. At time arrival, pt states the laceration was not bleeding. Pt denies any numbness or weakness. Pt denies being on any blood thinners. Pt denies having a hx of any blood disorders. He states he is UTD on his tetanus shot.   History reviewed. No pertinent past medical history. History reviewed. No pertinent past surgical history. History reviewed. No pertinent family history. History  Substance Use Topics  . Smoking status: Former Smoker    Types: Cigarettes  . Smokeless tobacco: Not on file  . Alcohol Use: Yes     Comment: occasional    Review of Systems  Skin: Positive for wound (laceration to right foot).  Neurological: Negative for weakness and numbness.    Allergies  Review of patient's allergies indicates no known allergies.  Home Medications   Current Outpatient Rx  Name  Route  Sig  Dispense  Refill  . aspirin 325 MG tablet   Oral   Take 325 mg by mouth as needed. For pain         . naproxen (NAPROSYN) 500 MG tablet   Oral   Take 1 tablet (500 mg total) by mouth 2 (two) times daily.   14  tablet   0    BP 120/69  Pulse 82  Temp(Src) 98 F (36.7 C) (Oral)  Resp 24  Ht 5\' 11"  (1.803 m)  Wt 175 lb (79.379 kg)  BMI 24.42 kg/m2  SpO2 100%  Physical Exam  Constitutional: He is oriented to person, place, and time. He appears well-developed and well-nourished. No distress.  HENT:  Head: Normocephalic.  Eyes: Conjunctivae are normal. Pupils are equal, round, and reactive to light. No scleral icterus.  Neck: Normal range of motion. Neck supple. No thyromegaly present.  Cardiovascular: Normal rate and regular rhythm.  Exam reveals no gallop and no friction rub.   No murmur heard. Capillary refill less than 2 seconds  Dorsalis pedis and Posterior tibial pulses 2 plus  Pulmonary/Chest: Effort normal and breath sounds normal. No respiratory distress. He has no wheezes. He has no rales.  Abdominal: Soft. Bowel sounds are normal. He exhibits no distension. There is no tenderness. There is no rebound.  Musculoskeletal: Normal range of motion.  Full ROM of 1st toe  Neurological: He is alert and oriented to person, place, and time.  Skin: Skin is warm and dry. No rash noted.  1-2 cm laceration to plantar surface of right foot No hematoma at cut site   Psychiatric: He has a normal mood and affect. His behavior is  normal.    ED Course  LACERATION REPAIR Date/Time: 08/04/2013 3:54 PM Performed by: Kathie Dike Authorized by: Kathie Dike Consent: Verbal consent obtained. Risks and benefits: risks, benefits and alternatives were discussed Consent given by: patient Patient understanding: patient states understanding of the procedure being performed Patient identity confirmed: arm band Time out: Immediately prior to procedure a "time out" was called to verify the correct patient, procedure, equipment, support staff and site/side marked as required. Body area: lower extremity Location details: right foot Laceration length: 1.6 cm Foreign bodies: no foreign  bodies Tendon involvement: none Nerve involvement: none Vascular damage: no Anesthesia: local infiltration Local anesthetic: lidocaine 1% without epinephrine Patient sedated: no Preparation: Patient was prepped and draped in the usual sterile fashion. Irrigation solution: saline Amount of cleaning: standard Debridement: none Degree of undermining: none Skin closure: Steri-Strips Number of sutures: 3 Technique: simple Approximation: close Approximation difficulty: simple Dressing: gauze roll Patient tolerance: Patient tolerated the procedure well with no immediate complications.   (including critical care time)  DIAGNOSTIC STUDIES: Oxygen Saturation is 100% on RA, Normal by my interpretation.    COORDINATION OF CARE: 8:58 PM- Will give pain medication. Discussed treatment plan with pt at bedside and pt agreed to plan.     Labs Review Labs Reviewed - No data to display Imaging Review Dg Foot Complete Right  08/03/2013   CLINICAL DATA:  Pain post trauma  EXAM: RIGHT FOOT COMPLETE - 3+ VIEW  COMPARISON:  None.  FINDINGS: Frontal, oblique, and lateral views were obtained. There is no fracture or dislocation. Joint spaces appear intact. No radiopaque foreign body.  IMPRESSION: No abnormality noted.   Electronically Signed   By: Bretta Bang M.D.   On: 08/03/2013 21:40    EKG Interpretation   None       MDM  No diagnosis found. **I have reviewed nursing notes, vital signs, and all appropriate lab and imaging results for this patient.* Pt sustained a laceration on the right foot from a broken plate. Xray is negative for fracture or fb. Wound repaired. Pt to have sutures removed in 7 days. *I have reviewed nursing notes, vital signs, and all appropriate lab and imaging results for this patient.Kathie Dike, PA-C 08/04/13 1558

## 2013-08-03 NOTE — ED Notes (Signed)
Triage documented on paper chart during downtime.

## 2013-08-03 NOTE — ED Notes (Signed)
Triage completed per paper chart during downtime at approx 1915

## 2013-08-03 NOTE — ED Notes (Signed)
PA at bedside.

## 2013-08-03 NOTE — ED Notes (Signed)
Paper chart triage note during downtime.

## 2013-08-05 ENCOUNTER — Encounter: Payer: Self-pay | Admitting: Family Medicine

## 2013-08-05 ENCOUNTER — Ambulatory Visit (INDEPENDENT_AMBULATORY_CARE_PROVIDER_SITE_OTHER): Payer: Self-pay | Admitting: Family Medicine

## 2013-08-05 VITALS — BP 122/70 | Temp 98.3°F | Ht 71.0 in | Wt 191.0 lb

## 2013-08-05 DIAGNOSIS — IMO0002 Reserved for concepts with insufficient information to code with codable children: Secondary | ICD-10-CM

## 2013-08-05 DIAGNOSIS — S91311S Laceration without foreign body, right foot, sequela: Secondary | ICD-10-CM

## 2013-08-05 NOTE — Progress Notes (Signed)
  Subjective:    Patient ID: Darryl Reed, male    DOB: Jan 19, 1986, 27 y.o.   MRN: [id number]  HPIHad sutures put in right foot. Sutures were put in Monday at [facility]. Noticied red streaks around sutures today.  The patient had to go to the ER on Monday he had a laceration 3 sutures were placed since then he is noted some reddened areas that he was concerned about possibility of infection so therefore he comes today to have this checked out.   Review of Systems No fever no pain or discomfort except some soreness at the site of the cut    Objective:   Physical Exam The laceration appears to be well healing there is no obvious sign of infection there is some bruising that I think was probably related to compression from either addressing or his socks I don't feel there is any type of intervention as necessary for       Assessment & Plan:  Laceration well healing no sign of infection, he is to followup near the 22nd to have sutures removed warning signs for infection were discussed in detail of any problems call us immediately

## 2013-08-06 NOTE — ED Provider Notes (Signed)
Medical screening examination/treatment/procedure(s) were performed by non-physician practitioner and as supervising physician I was immediately available for consultation/collaboration.   Joya Gaskins, MD 08/06/13 [phone]

## 2013-08-12 ENCOUNTER — Ambulatory Visit: Payer: Self-pay | Admitting: Family Medicine

## 2013-10-17 ENCOUNTER — Emergency Department (HOSPITAL_COMMUNITY)
Admission: EM | Admit: 2013-10-17 | Discharge: 2013-10-17 | Disposition: A | Discharge status: Home / Self Care | Payer: BC Managed Care – PPO | Attending: Emergency Medicine | Admitting: Emergency Medicine

## 2013-10-17 ENCOUNTER — Encounter (HOSPITAL_COMMUNITY): Payer: Self-pay | Admitting: Emergency Medicine

## 2013-10-17 DIAGNOSIS — B9789 Other viral agents as the cause of diseases classified elsewhere: Secondary | ICD-10-CM | POA: Insufficient documentation

## 2013-10-17 DIAGNOSIS — J3489 Other specified disorders of nose and nasal sinuses: Secondary | ICD-10-CM | POA: Insufficient documentation

## 2013-10-17 DIAGNOSIS — B349 Viral infection, unspecified: Secondary | ICD-10-CM

## 2013-10-17 DIAGNOSIS — F17200 Nicotine dependence, unspecified, uncomplicated: Secondary | ICD-10-CM | POA: Insufficient documentation

## 2013-10-17 DIAGNOSIS — R51 Headache: Secondary | ICD-10-CM | POA: Insufficient documentation

## 2013-10-17 DIAGNOSIS — R059 Cough, unspecified: Secondary | ICD-10-CM | POA: Insufficient documentation

## 2013-10-17 DIAGNOSIS — M2550 Pain in unspecified joint: Secondary | ICD-10-CM | POA: Insufficient documentation

## 2013-10-17 DIAGNOSIS — R0989 Other specified symptoms and signs involving the circulatory and respiratory systems: Secondary | ICD-10-CM | POA: Insufficient documentation

## 2013-10-17 DIAGNOSIS — IMO0001 Reserved for inherently not codable concepts without codable children: Secondary | ICD-10-CM | POA: Insufficient documentation

## 2013-10-17 DIAGNOSIS — R05 Cough: Secondary | ICD-10-CM | POA: Insufficient documentation

## 2013-10-17 MED ORDER — OSELTAMIVIR PHOSPHATE 75 MG PO CAPS: 75.0000 mg | ORAL_CAPSULE | Freq: Two times a day (BID) | ORAL | Status: DC

## 2013-10-17 MED ORDER — DEXTROMETHORPHAN POLISTIREX 30 MG/5ML PO LQCR: 15.0000 mg | ORAL | Status: DC | PRN

## 2013-10-17 NOTE — ED Notes (Signed)
I have a cough, head and chest congestion, and I have been running a fever per pt.

## 2013-10-17 NOTE — ED Provider Notes (Signed)
CSN: [id number]     Arrival date & time 10/17/13  1919 History   First MD Initiated Contact with Patient 10/17/13 2116     Chief Complaint  Patient presents with  . Cough  . Nasal Congestion  . Fever   (Consider location/radiation/quality/duration/timing/severity/associated sxs/prior Treatment) HPI Comments: Patient presents to the emergency department with chief complaint of cough, generalized myalgias and arthralgias, headache, chest congestion. States that he has intermittent subjective fevers. He has been sick for the past 48 hours. He is tried taking NyQuil for her symptoms with some relief. There no aggravating or alleviating factors. He denies any other symptoms or health problems. He did not get a flu shot this year.  The history is provided by the patient. No language interpreter was used.    History reviewed. No pertinent past medical history. History reviewed. No pertinent past surgical history. History reviewed. No pertinent family history. History  Substance Use Topics  . Smoking status: Current Every Day Smoker    Types: Cigarettes  . Smokeless tobacco: Not on file  . Alcohol Use: Yes     Comment: occasional    Review of Systems  All other systems reviewed and are negative.    Allergies  Review of patient's allergies indicates no known allergies.  Home Medications  No current outpatient prescriptions on file. BP 128/69  Pulse 123  Temp(Src) 98.7 F (37.1 C) (Oral)  Resp 20  Ht 6' (1.829 m)  Wt 180 lb (81.647 kg)  BMI 24.41 kg/m2  SpO2 99% Physical Exam  Nursing note and vitals reviewed. Constitutional: He is oriented to person, place, and time. He appears well-developed and well-nourished.  HENT:  Head: Normocephalic and atraumatic.  Right Ear: External ear normal.  Left Ear: External ear normal.  Nose: Nose normal.  Mouth/Throat: Oropharynx is clear and moist. No oropharyngeal exudate.  Mildly erythematous oropharynx, no tonsillar exudate, no  signs of tonsillar or peritonsillar abscesses, uvula is midline, airway is intact  Eyes: Conjunctivae and EOM are normal. Pupils are equal, round, and reactive to light. Right eye exhibits no discharge. Left eye exhibits no discharge. No scleral icterus.  Neck: Normal range of motion. Neck supple. No JVD present.  Cardiovascular: Normal rate, regular rhythm, normal heart sounds and intact distal pulses.  Exam reveals no gallop and no friction rub.   No murmur heard. Pulmonary/Chest: Effort normal and breath sounds normal. No respiratory distress. He has no wheezes. He has no rales. He exhibits no tenderness.  Abdominal: Soft. He exhibits no distension and no mass. There is no tenderness. There is no rebound and no guarding.  Musculoskeletal: Normal range of motion. He exhibits no edema and no tenderness.  Neurological: He is alert and oriented to person, place, and time. He has normal reflexes.  CN 3-12 intact  Skin: Skin is warm and dry.  Psychiatric: He has a normal mood and affect. His behavior is normal. Judgment and thought content normal.    ED Course  Procedures (including critical care time) Labs Review Labs Reviewed - No data to display Imaging Review No results found.  EKG Interpretation   None       MDM   1. Viral syndrome     Patient with symptoms consistent with influenza.  Vitals are stable, low-grade fever.  No signs of dehydration, tolerating PO's.  Lungs are clear. Due to patient's presentation and physical exam a chest x-ray was not ordered bc likely diagnosis of flu.  Patient will be discharged with instructions  to orally hydrate, rest, and use over-the-counter medications such as anti-inflammatories ibuprofen and Aleve for muscle aches and Tylenol for fever.  Patient will also be given a cough suppressant.   Filed Vitals:   10/17/13 2137  BP:   Pulse: 98  Temp: 97.8 F (36.6 C)  Resp:        Roxy Horseman, PA-C 10/17/13 2139

## 2013-10-17 NOTE — ED Provider Notes (Signed)
Medical screening examination/treatment/procedure(s) were performed by non-physician practitioner and as supervising physician I was immediately available for consultation/collaboration.  EKG Interpretation   None         Pat Elicker L Hudsyn Champine, MD 10/17/13 2345 

## 2013-11-06 ENCOUNTER — Ambulatory Visit (INDEPENDENT_AMBULATORY_CARE_PROVIDER_SITE_OTHER): Payer: BC Managed Care – PPO | Admitting: Family Medicine

## 2013-11-06 ENCOUNTER — Encounter: Payer: Self-pay | Admitting: Family Medicine

## 2013-11-06 VITALS — BP 128/77 | HR 101 | Temp 97.3°F | Ht 71.0 in | Wt 193.8 lb

## 2013-11-06 DIAGNOSIS — J029 Acute pharyngitis, unspecified: Secondary | ICD-10-CM

## 2013-11-06 DIAGNOSIS — R059 Cough, unspecified: Secondary | ICD-10-CM

## 2013-11-06 DIAGNOSIS — R05 Cough: Secondary | ICD-10-CM

## 2013-11-06 LAB — POCT INFLUENZA A/B
Influenza A, POC: NEGATIVE
Influenza B, POC: NEGATIVE

## 2013-11-06 LAB — POCT RAPID STREP A (OFFICE): Rapid Strep A Screen: NEGATIVE

## 2013-11-06 MED ORDER — AZITHROMYCIN 250 MG PO TABS: ORAL_TABLET | ORAL | Status: DC

## 2013-11-06 NOTE — Progress Notes (Signed)
   Subjective:    Patient ID: MALEKO GREULICH, male    DOB: November 04, 1985, 28 y.o.   MRN: [id number]  HPI  This 28 y.o. male presents for evaluation of URI sx's and sore throat for a week.  Review of Systems    No chest pain, SOB, HA, dizziness, vision change, N/V, diarrhea, constipation, dysuria, urinary urgency or frequency, myalgias, arthralgias or rash.  Objective:   Physical Exam  Vital signs noted  Well developed well nourished male.  HEENT - Head atraumatic Normocephalic                Eyes - PERRLA, Conjuctiva - clear Sclera- Clear EOMI                Ears - EAC's Wnl TM's Wnl Gross Hearing WNL                Throat - oropharanx injected and tonsils 2 plus Respiratory - Lungs CTA bilateral Cardiac - RRR S1 and S2 without murmur GI - Abdomen soft Nontender and bowel sounds active x 4 Extremities - No edema. Neuro - Grossly intact.      Assessment & Plan:  Sore throat - Plan: POCT Influenza A/B, POCT rapid strep A  Cough - Plan: POCT Influenza A/B, POCT rapid strep A  Push po fluids, rest, tylenol and motrin otc prn as directed for fever, arthralgias, and myalgias.  Follow up prn if sx's continue or persist.  Lysbeth Penner FNP

## 2013-12-04 ENCOUNTER — Ambulatory Visit (INDEPENDENT_AMBULATORY_CARE_PROVIDER_SITE_OTHER): Payer: BC Managed Care – PPO | Admitting: Family Medicine

## 2013-12-04 ENCOUNTER — Encounter: Payer: Self-pay | Admitting: Family Medicine

## 2013-12-04 VITALS — BP 140/80 | Ht 70.5 in | Wt 196.0 lb

## 2013-12-04 DIAGNOSIS — Z0000 Encounter for general adult medical examination without abnormal findings: Secondary | ICD-10-CM

## 2013-12-04 NOTE — Progress Notes (Signed)
   Subjective:    Patient ID: Darryl Reed, male    DOB: 11/05/1985, 28 y.o.   MRN: [id number]  HPI Patient is here for his annual wellness exam.   Pt smokes a half pack per day  Adds some salt to food  Some fats in diet, some fast food,  Moving to first shift daily  Soon  Had b w thru [employer], lipids good, liv zymes goodStates he has no concerns or issues at this time. Patient is doing very well.   PE's encouraged through his work place. Review of Systems  Constitutional: Negative for fever, activity change and appetite change.  HENT: Negative for congestion and rhinorrhea.   Eyes: Negative for discharge.  Respiratory: Negative for cough and wheezing.   Cardiovascular: Negative for chest pain.  Gastrointestinal: Negative for vomiting, abdominal pain and blood in stool.  Genitourinary: Negative for frequency and difficulty urinating.  Musculoskeletal: Negative for neck pain.  Skin: Negative for rash.  Allergic/Immunologic: Negative for environmental allergies and food allergies.  Neurological: Negative for weakness and headaches.  Psychiatric/Behavioral: Negative for agitation.       Objective:   Physical Exam  Constitutional: He appears well-developed and well-nourished.  HENT:  Head: Normocephalic and atraumatic.  Right Ear: External ear normal.  Left Ear: External ear normal.  Nose: Nose normal.  Mouth/Throat: Oropharynx is clear and moist.  Eyes: EOM are normal. Pupils are equal, round, and reactive to light.  Neck: Normal range of motion. Neck supple. No thyromegaly present.  Cardiovascular: Normal rate, regular rhythm and normal heart sounds.   No murmur heard. Pulmonary/Chest: Effort normal and breath sounds normal. No respiratory distress. He has no wheezes.  Abdominal: Soft. Bowel sounds are normal. He exhibits no distension and no mass. There is no tenderness.  Genitourinary: Penis normal.  Musculoskeletal: Normal range of motion. He exhibits no edema.    Lymphadenopathy:    He has no cervical adenopathy.  Neurological: He is alert. He exhibits normal muscle tone.  Skin: Skin is warm and dry. No erythema.  Psychiatric: He has a normal mood and affect. His behavior is normal. Judgment normal.    Blood pressure on repeat 128/76      Assessment & Plan:  Impression 1 wellness exam. #2 chronic smoker. #3 exercise level suboptimum. Plan encouraged to stop smoking. Information given. Diet exercise discussed. No blood work since recently done. Exercise strongly encourage. WSL

## 2014-02-17 ENCOUNTER — Encounter: Payer: Self-pay | Admitting: Family Medicine

## 2014-02-17 ENCOUNTER — Ambulatory Visit (INDEPENDENT_AMBULATORY_CARE_PROVIDER_SITE_OTHER): Payer: BC Managed Care – PPO | Admitting: Family Medicine

## 2014-02-17 VITALS — BP 124/80 | Temp 97.9°F | Ht 70.5 in | Wt 203.4 lb

## 2014-02-17 DIAGNOSIS — Z113 Encounter for screening for infections with a predominantly sexual mode of transmission: Secondary | ICD-10-CM

## 2014-02-17 DIAGNOSIS — R21 Rash and other nonspecific skin eruption: Secondary | ICD-10-CM

## 2014-02-17 MED ORDER — CLOTRIMAZOLE-BETAMETHASONE 1-0.05 % EX CREA: 1.0000 "application " | TOPICAL_CREAM | Freq: Two times a day (BID) | CUTANEOUS | Status: DC

## 2014-02-17 NOTE — Progress Notes (Signed)
   Subjective:    Patient ID: Darryl Reed, male    DOB: 1986/03/14, 28 y.o.   MRN: [id number]  Rash This is a new problem. The current episode started more than 1 month ago. The problem has been gradually improving since onset. The affected locations include the left axilla and right axilla. The rash is characterized by burning and pain. He was exposed to nothing. Treatments tried: changed deodorant. The treatment provided significant relief.  Patient states he has sharp pains in both underarms also. No other concerns at this time.   Had sharp pains and then an itching and burning  Broke out about a month ago  Large red dry area,  Still having pain  Pain last for few secods, burning with shower  Sore and sens,  Sweats with job, not a whoe lot  On further history patient also very anxious about possible hepatitis C exposure in the past. Had negative blood work soon after but still very worried about it. Nonspecific right flank symptoms intermittently. Worse with motion   Review of Systems  Skin: Positive for rash.   No chest pain no headache no weight loss no change in bowel habits ROS otherwise negative    Objective:   Physical Exam  Alert no apparent distress vitals reviewed. Lungs clear heart rare rhythm. Spine nontender. Flanks nontender abdomen benign. Axillary region mild intertrigo rash      Assessment & Plan:  Impression 1 intertrigo rash possible with irritation from deodorant #2 hepatitis C exposure plan appropriate blood work. Lotrisone cream twice a day to affected area. Symptomatic care discussed. WSL

## 2014-03-27 LAB — HEPATITIS C ANTIBODY: HCV Ab: NEGATIVE

## 2014-05-01 ENCOUNTER — Emergency Department (HOSPITAL_COMMUNITY)
Admission: EM | Admit: 2014-05-01 | Discharge: 2014-05-02 | Disposition: A | Discharge status: Home / Self Care | Payer: BC Managed Care – PPO | Attending: Emergency Medicine | Admitting: Emergency Medicine

## 2014-05-01 ENCOUNTER — Encounter (HOSPITAL_COMMUNITY): Payer: Self-pay | Admitting: Emergency Medicine

## 2014-05-01 DIAGNOSIS — R079 Chest pain, unspecified: Secondary | ICD-10-CM | POA: Insufficient documentation

## 2014-05-01 DIAGNOSIS — R0781 Pleurodynia: Secondary | ICD-10-CM

## 2014-05-01 DIAGNOSIS — Z79899 Other long term (current) drug therapy: Secondary | ICD-10-CM | POA: Insufficient documentation

## 2014-05-01 DIAGNOSIS — Z791 Long term (current) use of non-steroidal anti-inflammatories (NSAID): Secondary | ICD-10-CM | POA: Insufficient documentation

## 2014-05-01 DIAGNOSIS — F17200 Nicotine dependence, unspecified, uncomplicated: Secondary | ICD-10-CM | POA: Insufficient documentation

## 2014-05-01 MED ORDER — HYDROCODONE-ACETAMINOPHEN 5-325 MG PO TABS
2.0000 | ORAL_TABLET | Freq: Once | ORAL | Status: AC
  Administered 2014-05-02: 2 via ORAL
  Filled 2014-05-01: qty 2

## 2014-05-01 NOTE — ED Notes (Signed)
Pt states he has been having right rib pain and right back pain. Pt also c/o sharp pains under his underarms. Pt states this has been going on for a while now and has seen his PCP and hasn't had "any xray's."

## 2014-05-01 NOTE — ED Provider Notes (Signed)
CSN: [id number]     Arrival date & time 05/01/14  2251 History  This chart was scribed for [name], MD by Girtha Hake, ED Scribe. The patient was seen in [place]. The patient's care was started at 11:48 PM.      Chief Complaint  Patient presents with  . Back Pain    The history is provided by the patient and a relative.   HPI Comments: Darryl Reed is a 28 y.o. male who presents to the Emergency Department complaining of back pain beginning one year ago. He reports that the pain is exacerbated with inspiration. Patient denies fever, chills, blurred vision, dizziness, difficulty ambulating.  Pain is dailiy - worse with deep breathing and 10/10 at worst, mild at this time.  Constant, right-sided, worse with inspiration. Associated axillary tenderness.  Recent work-up at doctors office for hepatitis C was negative. No imaging done. No other labs done.   No associated weight loss.  PCP is Dr. Wolfgang Phoenix.   History reviewed. No pertinent past medical history. History reviewed. No pertinent past surgical history. Family History  Problem Relation Age of Onset  . Diabetes Mother   . Thyroid disease Mother   . COPD Father    History  Substance Use Topics  . Smoking status: Current Every Day Smoker    Types: Cigarettes  . Smokeless tobacco: Not on file  . Alcohol Use: Yes     Comment: occasional    Review of Systems  Constitutional: Negative for fever, chills and unexpected weight change.  Eyes: Negative for visual disturbance.  Cardiovascular: Positive for chest pain.  All other systems reviewed and are negative.     Allergies  Review of patient's allergies indicates no known allergies.  Home Medications   Prior to Admission medications   Medication Sig Start Date End Date Taking? Authorizing Provider  clotrimazole-betamethasone (LOTRISONE) cream Apply 1 application topically 2 (two) times daily. 02/17/14   Mikey Kirschner, MD  HYDROcodone-acetaminophen  (NORCO/VICODIN) 5-325 MG per tablet Take 2 tablets by mouth every 4 (four) hours as needed. 05/02/14   [name], MD  naproxen (NAPROSYN) 500 MG tablet Take 1 tablet (500 mg total) by mouth 2 (two) times daily with a meal. 05/02/14   [name], MD   Triage Vitals: BP 154/85  Pulse 113  Temp(Src) 97.4 F (36.3 C) (Oral)  Resp 18  Ht 6' (1.829 m)  Wt 205 lb (92.987 kg)  BMI 27.80 kg/m2  SpO2 98% Physical Exam  Nursing note and vitals reviewed. Constitutional: He is oriented to person, place, and time. He appears well-developed and well-nourished. No distress.  HENT:  Head: Normocephalic and atraumatic.  Mouth/Throat: Oropharynx is clear and moist. No oropharyngeal exudate.  Eyes: Conjunctivae and EOM are normal. Pupils are equal, round, and reactive to light. Right eye exhibits no discharge. Left eye exhibits no discharge. No scleral icterus.  Neck: Normal range of motion. Neck supple. No JVD present. No tracheal deviation present. No thyromegaly present.  Cardiovascular: Normal rate, regular rhythm, normal heart sounds and intact distal pulses.  Exam reveals no gallop and no friction rub.   No murmur heard. Pulmonary/Chest: Effort normal and breath sounds normal. No respiratory distress. He has no wheezes. He has no rales.  Pain with deep breathing. ttp across R ribs laterally  Abdominal: Soft. Bowel sounds are normal. He exhibits no distension and no mass. There is tenderness (along right rib cage around eight rib).  Musculoskeletal: Normal range of  motion. He exhibits no edema and no tenderness.  Lymphadenopathy:    He has no cervical adenopathy.  No LAD in the bialteral axilla  Neurological: He is alert and oriented to person, place, and time. Coordination normal.  Skin: Skin is warm and dry. No rash noted. No erythema.  Psychiatric: He has a normal mood and affect. His behavior is normal.    ED Course  Procedures (including critical care time) DIAGNOSTIC  STUDIES: Oxygen Saturation is 98% on room air, normal by my interpretation.    COORDINATION OF CARE: 1:30 AM-Discussed treatment plan which includes  (CXR, labs) with pt at bedside and pt agreed to plan.     Labs Review Labs Reviewed  COMPREHENSIVE METABOLIC PANEL - Abnormal; Notable for the following:    Potassium 3.6 (*)    All other components within normal limits  CBC WITH DIFFERENTIAL    Imaging Review Dg Ribs Unilateral W/chest Right  05/02/2014   CLINICAL DATA:  Right-sided rib pain for 1 year  EXAM: RIGHT RIBS AND CHEST - 3+ VIEW  COMPARISON:  11/19/2011  FINDINGS: No fracture or other bone lesions are seen involving the ribs. There is no evidence of pneumothorax or pleural effusion. Both lungs are clear. Heart size and mediastinal contours are within normal limits. The area of clinical concern as indicated by a BB is over the far posterior inferior ribs, however detail is suboptimally visualized due to obscuration by the hemidiaphragm. Allowing for this, no displaced rib fracture is identified.  IMPRESSION: Negative.   Electronically Signed   By: Conchita Paris M.D.   On: 05/02/2014 01:02      MDM   Final diagnoses:  Rib pain on right side    xrays and labs neg - VS improved and pain resolved with meds - can continue w/u as outpt - pt informed of results and agreeable to f/u in outpt setting with Dr. Wolfgang Phoenix.  I personally performed the services described in this documentation, which was scribed in my presence. The recorded information has been reviewed and is accurate.    Meds given in ED:  Medications  HYDROcodone-acetaminophen (NORCO/VICODIN) 5-325 MG per tablet 2 tablet (2 tablets Oral Given 05/02/14 0050)    New Prescriptions   HYDROCODONE-ACETAMINOPHEN (NORCO/VICODIN) 5-325 MG PER TABLET    Take 2 tablets by mouth every 4 (four) hours as needed.   NAPROXEN (NAPROSYN) 500 MG TABLET    Take 1 tablet (500 mg total) by mouth 2 (two) times daily with a meal.        [name], MD 05/02/14 0130

## 2014-05-02 ENCOUNTER — Emergency Department (HOSPITAL_COMMUNITY): Payer: BC Managed Care – PPO

## 2014-05-02 LAB — CBC WITH DIFFERENTIAL/PLATELET
BASOS ABS: 0 10*3/uL (ref 0.0–0.1)
Basophils Relative: 1 % (ref 0–1)
EOS ABS: 0.1 10*3/uL (ref 0.0–0.7)
EOS PCT: 2 % (ref 0–5)
HCT: 42.4 % (ref 39.0–52.0)
Hemoglobin: 14.9 g/dL (ref 13.0–17.0)
LYMPHS ABS: 2.2 10*3/uL (ref 0.7–4.0)
Lymphocytes Relative: 33 % (ref 12–46)
MCH: 31 pg (ref 26.0–34.0)
MCHC: 35.1 g/dL (ref 30.0–36.0)
MCV: 88.3 fL (ref 78.0–100.0)
Monocytes Absolute: 0.8 10*3/uL (ref 0.1–1.0)
Monocytes Relative: 12 % (ref 3–12)
Neutro Abs: 3.5 10*3/uL (ref 1.7–7.7)
Neutrophils Relative %: 52 % (ref 43–77)
Platelets: 244 10*3/uL (ref 150–400)
RBC: 4.8 MIL/uL (ref 4.22–5.81)
RDW: 12.1 % (ref 11.5–15.5)
WBC: 6.6 10*3/uL (ref 4.0–10.5)

## 2014-05-02 LAB — COMPREHENSIVE METABOLIC PANEL
ALT: 26 U/L (ref 0–53)
AST: 24 U/L (ref 0–37)
Albumin: 4.1 g/dL (ref 3.5–5.2)
Alkaline Phosphatase: 91 U/L (ref 39–117)
Anion gap: 11 (ref 5–15)
BUN: 14 mg/dL (ref 6–23)
CALCIUM: 9.1 mg/dL (ref 8.4–10.5)
CO2: 27 mEq/L (ref 19–32)
Chloride: 101 mEq/L (ref 96–112)
Creatinine, Ser: 0.85 mg/dL (ref 0.50–1.35)
GFR calc Af Amer: >90 mL/min (ref >90)
GFR calc non Af Amer: >90 mL/min (ref >90)
GLUCOSE: 82 mg/dL (ref 70–99)
Potassium: 3.6 mEq/L — ABNORMAL LOW (ref 3.7–5.3)
Sodium: 139 mEq/L (ref 137–147)
TOTAL PROTEIN: 7.4 g/dL (ref 6.0–8.3)
Total Bilirubin: 0.4 mg/dL (ref 0.3–1.2)

## 2014-05-02 MED ORDER — NAPROXEN 500 MG PO TABS: 500.0000 mg | ORAL_TABLET | Freq: Two times a day (BID) | ORAL | Status: DC

## 2014-05-02 MED ORDER — HYDROCODONE-ACETAMINOPHEN 5-325 MG PO TABS: 2.0000 | ORAL_TABLET | ORAL | Status: DC | PRN

## 2014-05-06 ENCOUNTER — Encounter: Payer: Self-pay | Admitting: Family Medicine

## 2014-05-06 ENCOUNTER — Ambulatory Visit (INDEPENDENT_AMBULATORY_CARE_PROVIDER_SITE_OTHER): Payer: BC Managed Care – PPO | Admitting: Family Medicine

## 2014-05-06 VITALS — BP 138/82 | Ht 70.5 in | Wt 213.0 lb

## 2014-05-06 DIAGNOSIS — R079 Chest pain, unspecified: Secondary | ICD-10-CM

## 2014-05-06 NOTE — Progress Notes (Signed)
   Subjective:    Patient ID: Darryl Reed, male    DOB: 1985/11/01, 28 y.o.   MRN: [id number]  HPI  Patient arrives for complaint of rib pain for over a year- has seen Dr Richardson Landry and been to Er with multiple test run that have all been normal but the pain remains.   Review of Systems He denies nausea reflux burning diarrhea sweats chills he does state side pain and discomfort with movement stretching pain kept him awake during the night. Denies fever.    Objective:   Physical Exam  Lungs are clear hearts regular neck no masses eardrums normal throat normal he has significant tenderness along the right side of the ribs. Radiates into his back. Worse on the right side along the axilla line. Abdomen is soft no guarding or rebound or tenderness  25 minutes was spent with patient evaluating his case reviewing the records in his presence as well as talking with the radiologist about his case    Assessment & Plan:  Rib pain discomfort-I reviewed his x-rays also reviewed this with the radiologist. It is possible that this could be I nerve impingement in his back or there could be something on the pleural lining that is causing this. Upon discussion with the radiologist the best next test would be a CT scan of the chest with contrast to make sure there is not something within the chest wall or outer lung causing this problem if this is normal then the next step would be referral to a physical medicine specialist such as Dr Ace Gins or Dr Nelva Bush for further evaluation of the possibility of I nerve block in his back to help him with pain

## 2014-05-12 ENCOUNTER — Ambulatory Visit (HOSPITAL_COMMUNITY)
Admission: RE | Admit: 2014-05-12 | Discharge: 2014-05-12 | Disposition: A | Discharge status: Home / Self Care | Payer: BC Managed Care – PPO | Source: Ambulatory Visit | Attending: Family Medicine | Admitting: Family Medicine

## 2014-05-12 DIAGNOSIS — R079 Chest pain, unspecified: Secondary | ICD-10-CM | POA: Insufficient documentation

## 2014-05-12 DIAGNOSIS — M5380 Other specified dorsopathies, site unspecified: Secondary | ICD-10-CM | POA: Insufficient documentation

## 2014-05-12 MED ORDER — IOHEXOL 300 MG/ML  SOLN
80.0000 mL | Freq: Once | INTRAMUSCULAR | Status: AC | PRN
  Administered 2014-05-12: 80 mL via INTRAVENOUS

## 2014-05-14 NOTE — Addendum Note (Signed)
Addended byCharolotte Capuchin D on: 05/14/2014 09:29 AM   Modules accepted: Orders

## 2014-05-14 NOTE — Progress Notes (Signed)
Patient notified and verbalized understanding of the test results. Referral placed.  

## 2014-06-02 ENCOUNTER — Other Ambulatory Visit (HOSPITAL_COMMUNITY): Payer: Self-pay | Admitting: Physical Medicine and Rehabilitation

## 2014-06-02 DIAGNOSIS — M546 Pain in thoracic spine: Secondary | ICD-10-CM

## 2014-06-07 ENCOUNTER — Ambulatory Visit (HOSPITAL_COMMUNITY)
Admission: RE | Admit: 2014-06-07 | Discharge: 2014-06-07 | Disposition: A | Discharge status: Home / Self Care | Payer: BC Managed Care – PPO | Source: Ambulatory Visit | Attending: Physical Medicine and Rehabilitation | Admitting: Physical Medicine and Rehabilitation

## 2014-06-07 DIAGNOSIS — M948X9 Other specified disorders of cartilage, unspecified sites: Secondary | ICD-10-CM | POA: Insufficient documentation

## 2014-06-07 DIAGNOSIS — G950 Syringomyelia and syringobulbia: Secondary | ICD-10-CM | POA: Diagnosis not present

## 2014-06-07 DIAGNOSIS — M546 Pain in thoracic spine: Secondary | ICD-10-CM

## 2014-06-13 ENCOUNTER — Encounter (HOSPITAL_COMMUNITY): Payer: Self-pay | Admitting: Emergency Medicine

## 2014-06-13 ENCOUNTER — Emergency Department (HOSPITAL_COMMUNITY)
Admission: EM | Admit: 2014-06-13 | Discharge: 2014-06-13 | Disposition: A | Discharge status: Home / Self Care | Payer: BC Managed Care – PPO | Attending: Emergency Medicine | Admitting: Emergency Medicine

## 2014-06-13 DIAGNOSIS — F17200 Nicotine dependence, unspecified, uncomplicated: Secondary | ICD-10-CM | POA: Diagnosis not present

## 2014-06-13 DIAGNOSIS — Z79899 Other long term (current) drug therapy: Secondary | ICD-10-CM | POA: Diagnosis not present

## 2014-06-13 DIAGNOSIS — R51 Headache: Secondary | ICD-10-CM | POA: Insufficient documentation

## 2014-06-13 DIAGNOSIS — J029 Acute pharyngitis, unspecified: Secondary | ICD-10-CM | POA: Diagnosis present

## 2014-06-13 MED ORDER — AMOXICILLIN 500 MG PO CAPS: 500.0000 mg | ORAL_CAPSULE | Freq: Three times a day (TID) | ORAL | Status: DC

## 2014-06-13 NOTE — ED Notes (Signed)
Pt c/o sore throat, low grade fever, chills that started yesterday,

## 2014-06-13 NOTE — Discharge Instructions (Signed)
Please wash hands frequently. Salt water gargles maybe helpful. Chloraseptic Spray may also be helpful, especially just before eating. Please keep your distance from others, and to allow anyone to share your eating utensils. Please use Amoxil 3 times daily with food. Use Tylenol every 4 hours, or ibuprofen every 6 hours for fever or aching if needed. Please see your primary physician, or return to the emergency department if any changes or complications. Pharyngitis Pharyngitis is a sore throat (pharynx). There is redness, pain, and swelling of your throat. HOME CARE   Drink enough fluids to keep your pee (urine) clear or pale yellow.  Only take medicine as told by your doctor.  You may get sick again if you do not take medicine as told. Finish your medicines, even if you start to feel better.  Do not take aspirin.  Rest.  Rinse your mouth (gargle) with salt water ( tsp of salt per 1 qt of water) every 1-2 hours. This will help the pain.  If you are not at risk for choking, you can suck on hard candy or sore throat lozenges. GET HELP IF:  You have large, tender lumps on your neck.  You have a rash.  You cough up green, yellow-brown, or bloody spit. GET HELP RIGHT AWAY IF:   You have a stiff neck.  You drool or cannot swallow liquids.  You throw up (vomit) or are not able to keep medicine or liquids down.  You have very bad pain that does not go away with medicine.  You have problems breathing (not from a stuffy nose). MAKE SURE YOU:   Understand these instructions.  Will watch your condition.  Will get help right away if you are not doing well or get worse. Document Released: 03/26/2008 Document Revised: 07/29/2013 Document Reviewed: 06/15/2013 [facility] Patient Information 2015 [place], [place]. This information is not intended to replace advice given to you by your health care provider. Make sure you discuss any questions you have with your health care provider.

## 2014-06-13 NOTE — ED Provider Notes (Signed)
CSN: [id number]     Arrival date & time 06/13/14  1535 History  This chart was scribed for a non-physician practitioner, Lily Kocher, PA-C, working with [name], MD by Martinique Peace, ED Scribe. The patient was seen in [facility]. The patient's care was started at 4:20 PM.    Chief Complaint  Patient presents with  . Sore Throat     Patient is a 28 y.o. male presenting with pharyngitis. The history is provided by the patient. No language interpreter was used.  Sore Throat Associated symptoms include headaches.   HPI Comments: Darryl Reed is a 28 y.o. male who presents to the Emergency Department complaining of sore throat onset yesterday with associated ear pain, low grade fever, and headache. Pt reports that he has been having trouble swallowing but has been drinking warm liquids to help soothe his throat. He states that he has taken Tylenol pta to help alleviate pain as well. He denies any nausea, vomiting, rash, nasal congestion, coughing or being around anyone that has been sick to his knowledge. He further denies currently being on any medication. Pt is a current every day smoker.   History reviewed. No pertinent past medical history. History reviewed. No pertinent past surgical history. Family History  Problem Relation Age of Onset  . Diabetes Mother   . Thyroid disease Mother   . COPD Father    History  Substance Use Topics  . Smoking status: Current Every Day Smoker    Types: Cigarettes  . Smokeless tobacco: Not on file  . Alcohol Use: Yes     Comment: occasional    Review of Systems  Constitutional: Positive for fever and chills.  HENT: Positive for sore throat. Negative for congestion.   Respiratory: Negative for cough.   Gastrointestinal: Negative for nausea and vomiting.  Skin: Negative for rash.  Neurological: Positive for headaches.  All other systems reviewed and are negative.     Allergies  Review of patient's allergies indicates no known  allergies.  Home Medications   Prior to Admission medications   Medication Sig Start Date End Date Taking? Authorizing Provider  clotrimazole-betamethasone (LOTRISONE) cream Apply 1 application topically 2 (two) times daily. 02/17/14   Mikey Kirschner, MD  HYDROcodone-acetaminophen (NORCO/VICODIN) 5-325 MG per tablet Take 2 tablets by mouth every 4 (four) hours as needed. 05/02/14   [name], MD  naproxen (NAPROSYN) 500 MG tablet Take 1 tablet (500 mg total) by mouth 2 (two) times daily with a meal. 05/02/14   [name], MD   BP 139/87  Temp(Src) 98.2 F (36.8 C) (Oral)  Resp 16  Ht 5\' 11"  (1.803 m)  Wt 190 lb (86.183 kg)  BMI 26.51 kg/m2  SpO2 99% Physical Exam  Nursing note and vitals reviewed. Constitutional: He is oriented to person, place, and time. He appears well-developed and well-nourished. No distress.  HENT:  Head: Normocephalic and atraumatic.  Right Ear: External ear normal.  Left Ear: External ear normal.  Mild nasal congestion. Increased redness of the posterior pharynx. Swelling and redness of uvula midline. No evidence of abscess. Airway patent. Speech clear and understandable.   Eyes: Conjunctivae and EOM are normal. Pupils are equal, round, and reactive to light.  Neck: Neck supple. No tracheal deviation present.  Cardiovascular: Normal rate.   Pulmonary/Chest: Effort normal. No respiratory distress.  Abdominal: Soft. Bowel sounds are normal.  Musculoskeletal: Normal range of motion.  Lymphadenopathy:    He has cervical adenopathy (mild to moderate. ).  Neurological: He is alert and oriented to person, place, and time.  Skin: Skin is warm and dry. No rash noted.  Psychiatric: He has a normal mood and affect. His behavior is normal.    ED Course  Procedures (including critical care time) Labs Review Labs Reviewed - No data to display  Results for orders placed during the hospital encounter of 05/01/14  CBC WITH DIFFERENTIAL      Result Value  Ref Range   WBC 6.6  4.0 - 10.5 K/uL   RBC 4.80  4.22 - 5.81 MIL/uL   Hemoglobin 14.9  13.0 - 17.0 g/dL   HCT 42.4  39.0 - 52.0 %   MCV 88.3  78.0 - 100.0 fL   MCH 31.0  26.0 - 34.0 pg   MCHC 35.1  30.0 - 36.0 g/dL   RDW 12.1  11.5 - 15.5 %   Platelets 244  150 - 400 K/uL   Neutrophils Relative % 52  43 - 77 %   Neutro Abs 3.5  1.7 - 7.7 K/uL   Lymphocytes Relative 33  12 - 46 %   Lymphs Abs 2.2  0.7 - 4.0 K/uL   Monocytes Relative 12  3 - 12 %   Monocytes Absolute 0.8  0.1 - 1.0 K/uL   Eosinophils Relative 2  0 - 5 %   Eosinophils Absolute 0.1  0.0 - 0.7 K/uL   Basophils Relative 1  0 - 1 %   Basophils Absolute 0.0  0.0 - 0.1 K/uL  COMPREHENSIVE METABOLIC PANEL      Result Value Ref Range   Sodium 139  137 - 147 mEq/L   Potassium 3.6 (*) 3.7 - 5.3 mEq/L   Chloride 101  96 - 112 mEq/L   CO2 27  19 - 32 mEq/L   Glucose, Bld 82  70 - 99 mg/dL   BUN 14  6 - 23 mg/dL   Creatinine, Ser 0.85  0.50 - 1.35 mg/dL   Calcium 9.1  8.4 - 10.5 mg/dL   Total Protein 7.4  6.0 - 8.3 g/dL   Albumin 4.1  3.5 - 5.2 g/dL   AST 24  0 - 37 U/L   ALT 26  0 - 53 U/L   Alkaline Phosphatase 91  39 - 117 U/L   Total Bilirubin 0.4  0.3 - 1.2 mg/dL   GFR calc non Af Amer >90  >90 mL/min   GFR calc Af Amer >90  >90 mL/min   Anion gap 11  5 - 15   Mr Thoracic Spine Wo Contrast  06/08/2014   ADDENDUM REPORT: 06/08/2014 09:06  ADDENDUM: Differential diagnosis for the abnormal bone marrow edema at T8-9 also includes osteoarthritis of the facet joints at T7-8. Review of the CT does reveal cartilage loss subchondral microcystic formation and osteophyte formation involving both facet joints at this level. Based on this finding, osteoarthritis of the facet joints may be the most likely diagnosis.   Electronically Signed   By: Franchot Gallo M.D.   On: 06/08/2014 09:06   06/08/2014   CLINICAL DATA:  Mid back pain  EXAM: MRI THORACIC SPINE WITHOUT CONTRAST  TECHNIQUE: Multiplanar, multisequence MR imaging of the  thoracic spine was performed. No intravenous contrast was administered.  COMPARISON:  CT head 05/12/2014  FINDINGS: Mild levoscoliosis at approximately T4. Mild kyphosis at T11-12. 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 No cord compression or cord lesion is identified. Remainder of the spinal  cord is normal.  Abnormal bone marrow signal is present in the spinous process of T8 which is hypo intense on T1 and hyperintense on T2. Similar signal changes are present in the lamina and pedicle of T8 and T9 bilaterally. The vertebral body signal is normal. No fracture is seen. There is no cord compression or epidural mass at this level.  Negative for vertebral body fracture. Vertebral body signal is normal.  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 Postcontrast imaging recommended to exclude cord lesion.  Abnormal bone marrow signal in the posterior elements at T8 and T9. Review of the recent CT does not show any evidence of sclerotic or lytic bone lesion. Based on the MRI, this could represent a neoplastic process such as lymphoma. Lack of bony destruction and the patient's age would be unusual for metastatic disease. Infection also possible. Consider indolent facet and bone infection. Other benign bone tumors also can occur in the posterior elements.  Postcontrast imaging of the thoracic spine recommended for further evaluation.  Electronically Signed: By: Franchot Gallo M.D. On: 06/07/2014 21:28      Imaging Review No results found.   EKG Interpretation None     Medications - No data to display  4:26 PM- Treatment plan was discussed with patient who verbalizes understanding and agrees.   MDM Vital signs are well within normal limits. History and physical examination consistent with pharyngitis. Patient will be treated with Amoxil 3 times daily. Patient has been encouraged to use salt water gargles. Patient is also been asked to keep his distance  from others, and wash hands frequently. He will use Tylenol and ibuprofen for soreness and headache. He is to see his primary physician, or return to the emergency department if any changes, problems, or concerns.    Final diagnoses:  None    **I have reviewed nursing notes, vital signs, and all appropriate lab and imaging results for this patient.*  **I personally performed the services described in this documentation, which was scribed in my presence. The recorded information has been reviewed and is accurate.Lenox Ahr, PA-C 06/13/14 1646

## 2014-06-13 NOTE — ED Notes (Signed)
Pt reports sore throat starting yesterday with worsening pain. Pain is in back of throat on right side and ear pain.  Pt took tylenol severe cold earlier today. Pt reports chills and fever, joint pain, weakness.  Pt reports it hurts to swallow.  Warm liquids help soothe throat

## 2014-06-14 NOTE — ED Provider Notes (Signed)
Medical screening examination/treatment/procedure(s) were performed by non-physician practitioner and as supervising physician I was immediately available for consultation/collaboration.    [name], MD 06/14/14 [phone]

## 2014-06-30 ENCOUNTER — Other Ambulatory Visit (HOSPITAL_COMMUNITY): Payer: Self-pay | Admitting: Physical Medicine and Rehabilitation

## 2014-06-30 DIAGNOSIS — R9389 Abnormal findings on diagnostic imaging of other specified body structures: Secondary | ICD-10-CM

## 2014-07-01 ENCOUNTER — Ambulatory Visit (HOSPITAL_COMMUNITY): Payer: BC Managed Care – PPO

## 2014-07-01 ENCOUNTER — Encounter (HOSPITAL_COMMUNITY): Payer: BC Managed Care – PPO

## 2014-07-07 ENCOUNTER — Encounter (HOSPITAL_COMMUNITY)
Admission: RE | Admit: 2014-07-07 | Discharge: 2014-07-07 | Disposition: A | Discharge status: Home / Self Care | Payer: BC Managed Care – PPO | Source: Ambulatory Visit | Attending: Physical Medicine and Rehabilitation | Admitting: Physical Medicine and Rehabilitation

## 2014-07-07 ENCOUNTER — Encounter (HOSPITAL_COMMUNITY): Payer: Self-pay

## 2014-07-07 DIAGNOSIS — R9389 Abnormal findings on diagnostic imaging of other specified body structures: Secondary | ICD-10-CM | POA: Diagnosis present

## 2014-07-07 DIAGNOSIS — M549 Dorsalgia, unspecified: Secondary | ICD-10-CM | POA: Diagnosis present

## 2014-07-07 MED ORDER — TECHNETIUM TC 99M MEDRONATE IV KIT
25.0000 | PACK | Freq: Once | INTRAVENOUS | Status: AC | PRN
  Administered 2014-07-07: 25 via INTRAVENOUS

## 2014-10-17 IMAGING — CR DG RIBS W/ CHEST 3+V*R*
5 series · 5 of 5 positions shown · non-contrast
Comparison: 11/19/2011

CLINICAL DATA: Right-sided rib pain for 1 year

EXAM:
RIGHT RIBS AND CHEST - 3+ VIEW

[view not recorded (1 of 5)]
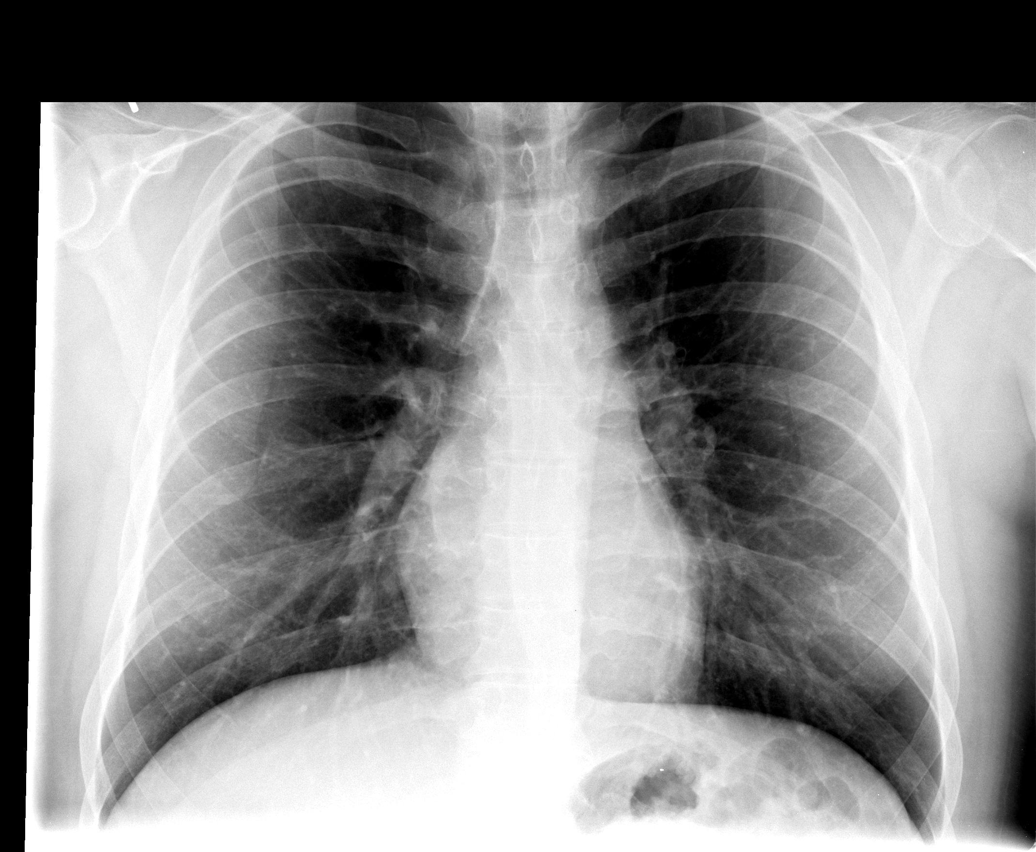

[view not recorded (2 of 5)]
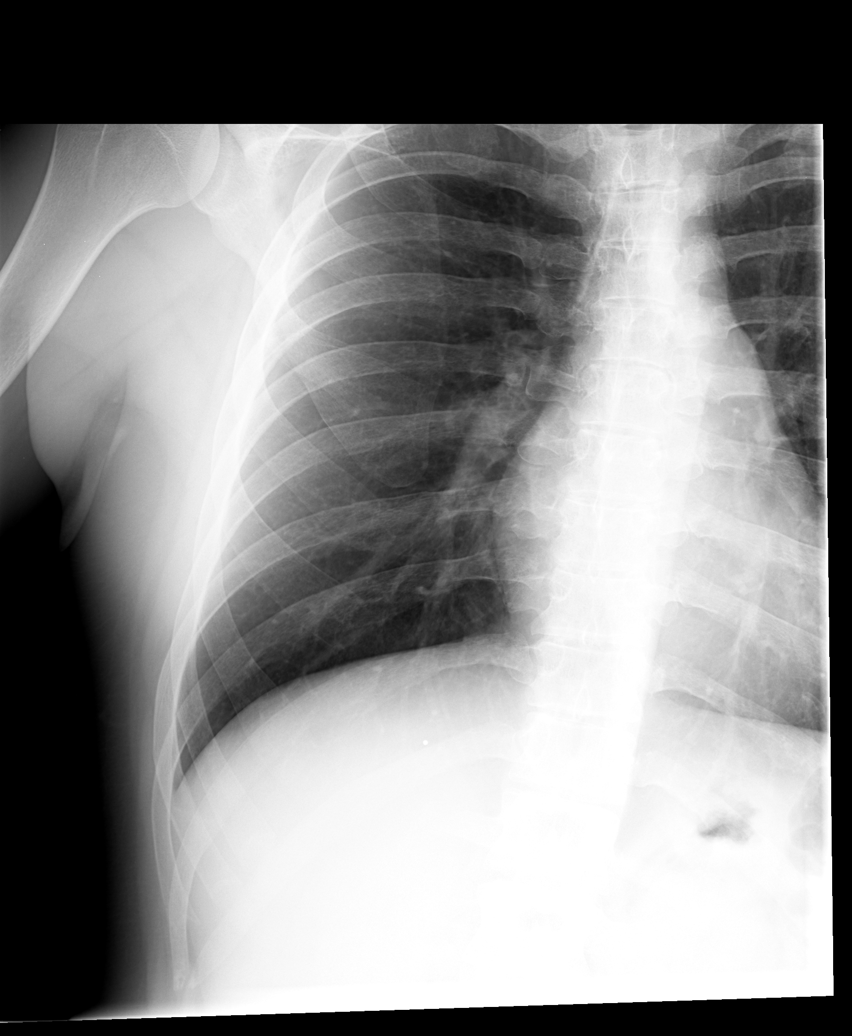

[view not recorded (3 of 5)]
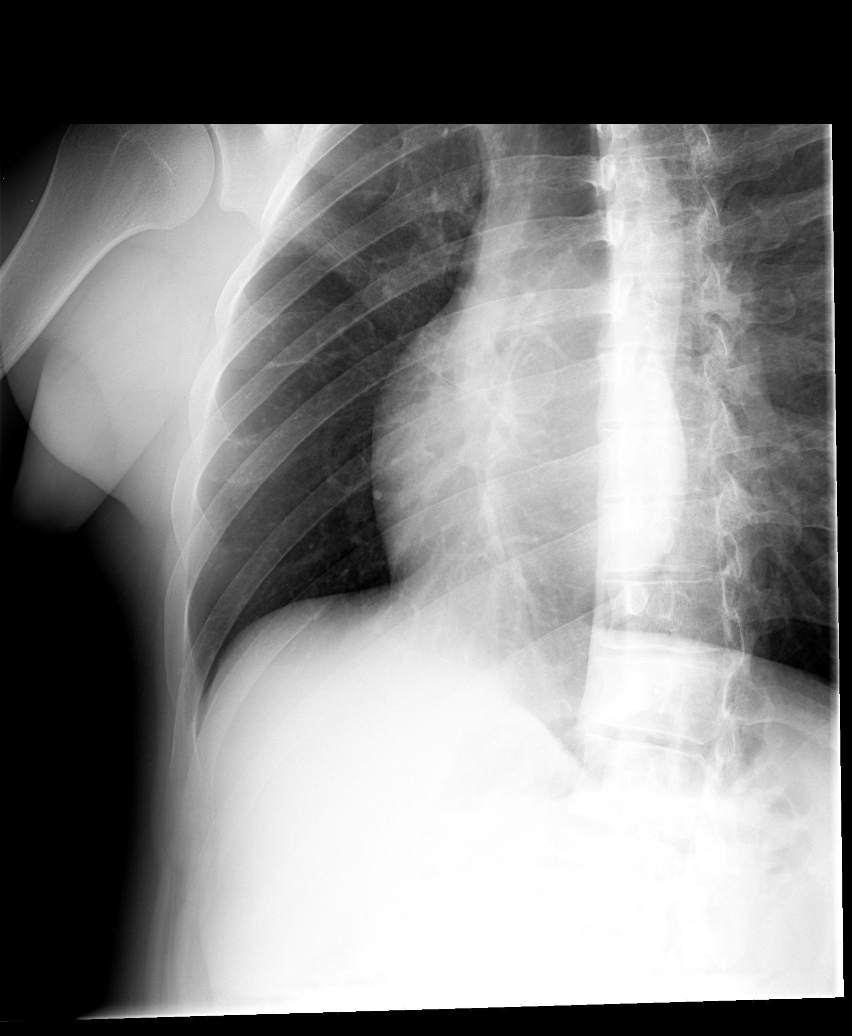

[view not recorded (4 of 5)]
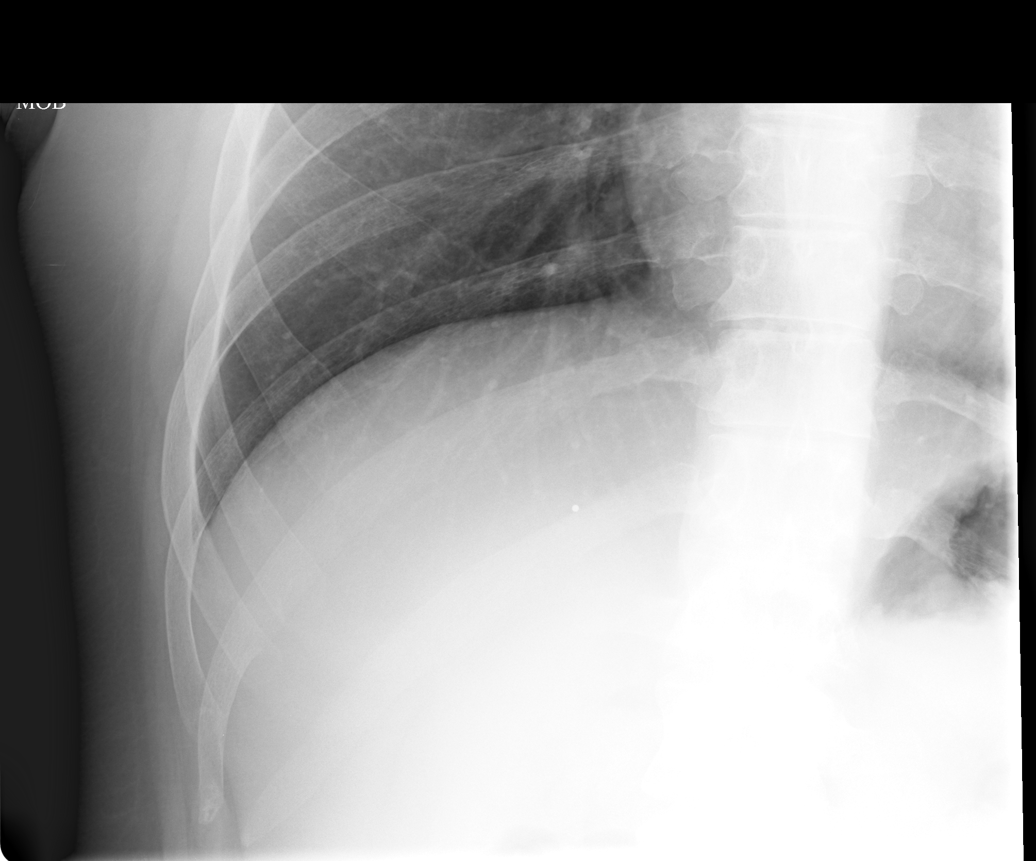

[view not recorded (5 of 5)]
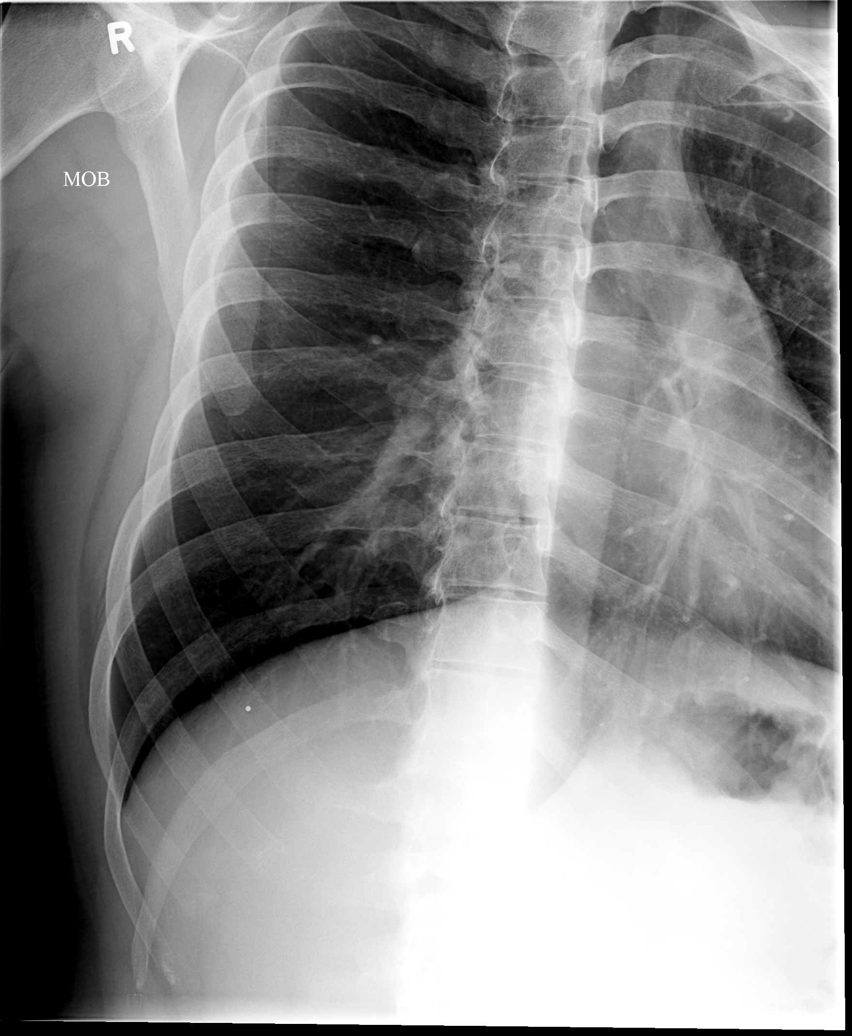

[5 of 5 positions shown; findings below may reference images not displayed]

FINDINGS: No fracture or other bone lesions are seen involving the ribs. There
is no evidence of pneumothorax or pleural effusion. Both lungs are
clear. Heart size and mediastinal contours are within normal limits.
The area of clinical concern as indicated by a BB is over the far
posterior inferior ribs, however detail is suboptimally visualized
due to obscuration by the hemidiaphragm. Allowing for this, no
displaced rib fracture is identified.
IMPRESSION: Negative.

## 2014-12-03 ENCOUNTER — Encounter: Payer: Self-pay | Admitting: Nurse Practitioner

## 2014-12-03 ENCOUNTER — Ambulatory Visit (INDEPENDENT_AMBULATORY_CARE_PROVIDER_SITE_OTHER): Payer: BLUE CROSS/BLUE SHIELD | Admitting: Nurse Practitioner

## 2014-12-03 VITALS — BP 112/80 | Temp 98.8°F | Ht 70.5 in | Wt 208.4 lb

## 2014-12-03 DIAGNOSIS — J300 Vasomotor rhinitis: Secondary | ICD-10-CM

## 2014-12-03 DIAGNOSIS — Z113 Encounter for screening for infections with a predominantly sexual mode of transmission: Secondary | ICD-10-CM

## 2014-12-03 LAB — RPR

## 2014-12-03 LAB — HIV ANTIBODY (ROUTINE TESTING W REFLEX): HIV: NONREACTIVE

## 2014-12-03 LAB — POCT URINALYSIS DIPSTICK
Spec Grav, UA: <=1.005
pH, UA: 7

## 2014-12-03 LAB — HEPATITIS C ANTIBODY: HCV Ab: NEGATIVE

## 2014-12-03 MED ORDER — FLUTICASONE PROPIONATE 50 MCG/ACT NA SUSP: 2.0000 | Freq: Every day | NASAL | Status: DC

## 2014-12-03 NOTE — Patient Instructions (Signed)
-

## 2014-12-03 NOTE — Progress Notes (Signed)
Subjective:  Presents to discuss STD testing. Had a one time incident of unprotected sex about 6 months ago. Mild urinary frequency. No dysuria, penile drainage or fever. Had a unknown rash on the back of the left hand which has resolved. No genital rash. Slight sore throat off/on x 2 months after having a bad cold. No ear pain. Mild head congestion and clear runny nose. No cough. Just clearing throat. No acid reflux or heartburn. No unusual fatigue.   Objective:   BP 112/80 mmHg  Temp(Src) 98.8 F (37.1 C) (Oral)  Ht 5' 10.5" (1.791 m)  Wt 208 lb 6 oz (94.518 kg)  BMI 29.47 kg/m2 NAD. Alert, oriented. TMs retracted no erythema. Pharynx no erythema; clear PND noted. Neck supple with mild anterior adenopathy. Lungs clear. Heart RRR. Abdomen soft, non tender. UA neg.   Assessment: Vasomotor rhinitis  Screen for STD (sexually transmitted disease) - Plan: POCT urinalysis dipstick, GC/chlamydia probe amp, urine, HIV antibody, Hepatitis C Antibody, RPR, HSV(herpes simplex vrs) 1+2 ab-IgG  Plan:  Meds ordered this encounter  Medications  . fluticasone (FLONASE) 50 MCG/ACT nasal spray    Sig: Place 2 sprays into both nostrils daily.    Dispense:  16 g    Refill:  5    Order Specific Question:  Supervising Provider    Answer:  Mikey Kirschner [[id number]]  OTC antihistamines as directed.  Discussed safe sex issues.  Recheck if further problems.

## 2014-12-04 LAB — GC/CHLAMYDIA PROBE AMP, URINE
Chlamydia, Swab/Urine, PCR: NEGATIVE
GC PROBE AMP, URINE: NEGATIVE

## 2014-12-06 LAB — HSV(HERPES SIMPLEX VRS) I + II AB-IGG
HSV 1 Glycoprotein G Ab, IgG: 9.88 IV — ABNORMAL HIGH
HSV 2 GLYCOPROTEIN G AB, IGG: 0.11 IV

## 2015-02-07 ENCOUNTER — Emergency Department (HOSPITAL_COMMUNITY): Payer: BLUE CROSS/BLUE SHIELD

## 2015-02-07 ENCOUNTER — Encounter (HOSPITAL_COMMUNITY): Payer: Self-pay | Admitting: *Deleted

## 2015-02-07 ENCOUNTER — Emergency Department (HOSPITAL_COMMUNITY)
Admission: EM | Admit: 2015-02-07 | Discharge: 2015-02-07 | Disposition: A | Discharge status: Home / Self Care | Payer: BLUE CROSS/BLUE SHIELD | Attending: Emergency Medicine | Admitting: Emergency Medicine

## 2015-02-07 DIAGNOSIS — R079 Chest pain, unspecified: Secondary | ICD-10-CM | POA: Diagnosis present

## 2015-02-07 DIAGNOSIS — Z7951 Long term (current) use of inhaled steroids: Secondary | ICD-10-CM | POA: Insufficient documentation

## 2015-02-07 DIAGNOSIS — Z7982 Long term (current) use of aspirin: Secondary | ICD-10-CM | POA: Insufficient documentation

## 2015-02-07 DIAGNOSIS — Z79899 Other long term (current) drug therapy: Secondary | ICD-10-CM | POA: Diagnosis not present

## 2015-02-07 DIAGNOSIS — Z720 Tobacco use: Secondary | ICD-10-CM | POA: Diagnosis not present

## 2015-02-07 LAB — BASIC METABOLIC PANEL
ANION GAP: 8 (ref 5–15)
BUN: 16 mg/dL (ref 6–23)
CO2: 24 mmol/L (ref 19–32)
Calcium: 8.5 mg/dL (ref 8.4–10.5)
Chloride: 103 mmol/L (ref 96–112)
Creatinine, Ser: 0.95 mg/dL (ref 0.50–1.35)
Glucose, Bld: 80 mg/dL (ref 70–99)
POTASSIUM: 3.4 mmol/L — AB (ref 3.5–5.1)
Sodium: 135 mmol/L (ref 135–145)

## 2015-02-07 LAB — CBC
HCT: 43.8 % (ref 39.0–52.0)
HEMOGLOBIN: 15.1 g/dL (ref 13.0–17.0)
MCH: 31.9 pg (ref 26.0–34.0)
MCHC: 34.5 g/dL (ref 30.0–36.0)
MCV: 92.4 fL (ref 78.0–100.0)
PLATELETS: 201 10*3/uL (ref 150–400)
RBC: 4.74 MIL/uL (ref 4.22–5.81)
RDW: 12.6 % (ref 11.5–15.5)
WBC: 6.4 10*3/uL (ref 4.0–10.5)

## 2015-02-07 LAB — TROPONIN I: Troponin I: <0.03 ng/mL (ref <0.031)

## 2015-02-07 MED ORDER — RANITIDINE HCL 150 MG PO CAPS: 150.0000 mg | ORAL_CAPSULE | Freq: Two times a day (BID) | ORAL | Status: DC

## 2015-02-07 MED ORDER — PANTOPRAZOLE SODIUM 40 MG IV SOLR
40.0000 mg | Freq: Once | INTRAVENOUS | Status: AC
  Administered 2015-02-07: 40 mg via INTRAVENOUS
  Filled 2015-02-07: qty 40

## 2015-02-07 NOTE — ED Notes (Signed)
Pt alert & oriented x4, stable gait. Patient given discharge instructions, paperwork & prescription(s). Patient  instructed to stop at the registration desk to finish any additional paperwork. Patient verbalized understanding. Pt left department w/ no further questions. 

## 2015-02-07 NOTE — ED Provider Notes (Signed)
CSN: [id number]     Arrival date & time 02/07/15  1528 History   First MD Initiated Contact with Patient 02/07/15 1547     Chief Complaint  Patient presents with  . Chest Pain     (Consider location/radiation/quality/duration/timing/severity/associated sxs/prior Treatment) Patient is a 29 y.o. male presenting with chest pain. The history is provided by the patient (the pt complains of chest pain today).  Chest Pain Pain location:  Substernal area Pain quality: aching   Pain radiates to:  L arm Pain radiates to the back: no   Pain severity:  Moderate Onset quality:  Sudden Timing:  Constant Progression:  Waxing and waning Associated symptoms: no abdominal pain, no back pain, no cough, no fatigue and no headache     History reviewed. No pertinent past medical history. History reviewed. No pertinent past surgical history. Family History  Problem Relation Age of Onset  . Diabetes Mother   . Thyroid disease Mother   . COPD Father    History  Substance Use Topics  . Smoking status: Current Every Day Smoker    Types: Cigarettes  . Smokeless tobacco: Not on file  . Alcohol Use: Yes     Comment: occasional    Review of Systems  Constitutional: Negative for appetite change and fatigue.  HENT: Negative for congestion, ear discharge and sinus pressure.   Eyes: Negative for discharge.  Respiratory: Negative for cough.   Cardiovascular: Positive for chest pain.  Gastrointestinal: Negative for abdominal pain and diarrhea.  Genitourinary: Negative for frequency and hematuria.  Musculoskeletal: Negative for back pain.  Skin: Negative for rash.  Neurological: Negative for seizures and headaches.  Psychiatric/Behavioral: Negative for hallucinations.      Allergies  Review of patient's allergies indicates no known allergies.  Home Medications   Prior to Admission medications   Medication Sig Start Date End Date Taking? Authorizing Provider  aspirin EC 81 MG tablet Take 324  mg by mouth once as needed for moderate pain.   Yes Historical Provider, MD  Ca Carbonate-Mag Hydroxide (ROLAIDS PO) Take 1-2 tablets by mouth as needed (FOR INDIGESTION).   Yes Historical Provider, MD  nitroGLYCERIN (NITROSTAT) 0.4 MG SL tablet Place 0.4 mg under the tongue every 5 (five) minutes as needed for chest pain.   Yes Historical Provider, MD  fluticasone (FLONASE) 50 MCG/ACT nasal spray Place 2 sprays into both nostrils daily. Patient not taking: Reported on 02/07/2015 12/03/14   Nilda Simmer, NP  ranitidine (ZANTAC) 150 MG capsule Take 1 capsule (150 mg total) by mouth 2 (two) times daily. 02/07/15   Milton Ferguson, MD   BP 111/69 mmHg  Pulse 90  Temp(Src) 98.3 F (36.8 C) (Oral)  Resp 16  Ht 5\' 11"  (1.803 m)  Wt 200 lb (90.719 kg)  BMI 27.91 kg/m2  SpO2 99% Physical Exam  Constitutional: He is oriented to person, place, and time. He appears well-developed.  HENT:  Head: Normocephalic.  Eyes: Conjunctivae and EOM are normal. No scleral icterus.  Neck: Neck supple. No thyromegaly present.  Cardiovascular: Normal rate and regular rhythm.  Exam reveals no gallop and no friction rub.   No murmur heard. Pulmonary/Chest: No stridor. He has no wheezes. He has no rales. He exhibits no tenderness.  Abdominal: He exhibits no distension. There is no tenderness. There is no rebound.  Musculoskeletal: Normal range of motion. He exhibits no edema.  Lymphadenopathy:    He has no cervical adenopathy.  Neurological: He is oriented to person, place, and  time. He exhibits normal muscle tone. Coordination normal.  Skin: No rash noted. No erythema.  Psychiatric: He has a normal mood and affect. His behavior is normal.    ED Course  Procedures (including critical care time) Labs Review Labs Reviewed  BASIC METABOLIC PANEL - Abnormal; Notable for the following:    Potassium 3.4 (*)    All other components within normal limits  CBC  TROPONIN I  TROPONIN I    Imaging Review Dg  Chest Port 1 View  02/07/2015   CLINICAL DATA:  Acute chest pain.  EXAM: PORTABLE CHEST - 1 VIEW  COMPARISON:  May 02, 2014.  FINDINGS: The heart size and mediastinal contours are within normal limits. Both lungs are clear. No pneumothorax or pleural effusion is noted. The visualized skeletal structures are unremarkable.  IMPRESSION: No acute cardiopulmonary abnormality seen.   Electronically Signed   By: Marijo Conception, M.D.   On: 02/07/2015 15:58     EKG Interpretation None      MDM   Final diagnoses:  Chest pain at rest    Chest pain,  Nl studies,  tx with zantac and follow up with pcp    Milton Ferguson, MD 02/07/15 [phone]

## 2015-02-07 NOTE — ED Notes (Signed)
Pt by ems for sharp CP radiating to left shoulder with numbness to left arm.  CP is intermittent (intermittent sharp shooting pains) increased with movement.  No n/v with this, no diaphoresis.  Pt had dizziness with pain.  EKG showed sinus tach for ems.  IV in left arm.

## 2015-02-07 NOTE — Discharge Instructions (Signed)
Follow up with your md next week. °

## 2015-03-25 ENCOUNTER — Emergency Department (HOSPITAL_COMMUNITY)
Admission: EM | Admit: 2015-03-25 | Discharge: 2015-03-25 | Disposition: A | Discharge status: Home / Self Care | Payer: BLUE CROSS/BLUE SHIELD | Attending: Emergency Medicine | Admitting: Emergency Medicine

## 2015-03-25 ENCOUNTER — Encounter (HOSPITAL_COMMUNITY): Payer: Self-pay | Admitting: Emergency Medicine

## 2015-03-25 DIAGNOSIS — Z7982 Long term (current) use of aspirin: Secondary | ICD-10-CM | POA: Diagnosis not present

## 2015-03-25 DIAGNOSIS — B86 Scabies: Secondary | ICD-10-CM | POA: Insufficient documentation

## 2015-03-25 DIAGNOSIS — R21 Rash and other nonspecific skin eruption: Secondary | ICD-10-CM

## 2015-03-25 DIAGNOSIS — Z720 Tobacco use: Secondary | ICD-10-CM | POA: Diagnosis not present

## 2015-03-25 DIAGNOSIS — Z79899 Other long term (current) drug therapy: Secondary | ICD-10-CM | POA: Diagnosis not present

## 2015-03-25 DIAGNOSIS — Z7951 Long term (current) use of inhaled steroids: Secondary | ICD-10-CM | POA: Insufficient documentation

## 2015-03-25 MED ORDER — PERMETHRIN 5 % EX CREA: TOPICAL_CREAM | CUTANEOUS | Status: DC

## 2015-03-25 NOTE — ED Notes (Signed)
Pt reports generalized rash for last several weeks. Pt denies any chills,fever.

## 2015-03-25 NOTE — ED Provider Notes (Signed)
CSN: [id number]     Arrival date & time 03/25/15  1506 History   First MD Initiated Contact with Patient 03/25/15 1515     Chief Complaint  Patient presents with  . Rash     (Consider location/radiation/quality/duration/timing/severity/associated sxs/prior Treatment) HPI Comments: 29 year old male smoking history presents with itchy rash on lower legs, webs of fingers and groin area for the past 10 days. Patient was doing landscape recently. No other new exposures. No other contacts with rash per no new sexual partners. No fevers or chills. Patient did have secondhand bed given to him recently.  Patient is a 29 y.o. male presenting with rash. The history is provided by the patient.  Rash Associated symptoms: no fever     History reviewed. No pertinent past medical history. History reviewed. No pertinent past surgical history. Family History  Problem Relation Age of Onset  . Diabetes Mother   . Thyroid disease Mother   . COPD Father    History  Substance Use Topics  . Smoking status: Current Every Day Smoker -- 0.50 packs/day    Types: Cigarettes  . Smokeless tobacco: Not on file  . Alcohol Use: Yes     Comment: occasional    Review of Systems  Constitutional: Negative for fever and chills.  Skin: Positive for rash.      Allergies  Review of patient's allergies indicates no known allergies.  Home Medications   Prior to Admission medications   Medication Sig Start Date End Date Taking? Authorizing Provider  aspirin EC 81 MG tablet Take 324 mg by mouth once as needed for moderate pain.    Historical Provider, MD  Ca Carbonate-Mag Hydroxide (ROLAIDS PO) Take 1-2 tablets by mouth as needed (FOR INDIGESTION).    Historical Provider, MD  fluticasone (FLONASE) 50 MCG/ACT nasal spray Place 2 sprays into both nostrils daily. Patient not taking: Reported on 02/07/2015 12/03/14   Nilda Simmer, NP  nitroGLYCERIN (NITROSTAT) 0.4 MG SL tablet Place 0.4 mg under the tongue every  5 (five) minutes as needed for chest pain.    Historical Provider, MD  permethrin (ELIMITE) 5 % cream Apply to entire body except mouth, nares, eyes and keep on for 10 hrs then wash off, repeat in 1 wk if still symptoms 03/25/15   Elnora Morrison, MD  ranitidine (ZANTAC) 150 MG capsule Take 1 capsule (150 mg total) by mouth 2 (two) times daily. 02/07/15   Milton Ferguson, MD   BP 138/79 mmHg  Pulse 114  Temp(Src) 98.2 F (36.8 C) (Oral)  Resp 18  Ht 6' (1.829 m)  Wt 200 lb (90.719 kg)  BMI 27.12 kg/m2  SpO2 99% Physical Exam  Constitutional: He is oriented to person, place, and time. He appears well-developed and well-nourished.  HENT:  Head: Normocephalic and atraumatic.  Eyes: Right eye exhibits no discharge. Left eye exhibits no discharge.  Neck: Normal range of motion. Neck supple. No tracheal deviation present.  Cardiovascular: Regular rhythm.   Pulmonary/Chest: Effort normal.  Musculoskeletal: He exhibits no edema.  Neurological: He is alert and oriented to person, place, and time.  Skin: Skin is warm. Rash noted.  Patient has small papular rash with excoriations anterior lower legs, webs of fingers, no induration or active infection. No purpura or petechia.  Psychiatric: He has a normal mood and affect.  Nursing note and vitals reviewed.   ED Course  Procedures (including critical care time) Labs Review Labs Reviewed - No data to display  Imaging Review No results  found.   EKG Interpretation None      MDM   Final diagnoses:  Scabies  Rash and nonspecific skin eruption    Well appearing, concern for scabies. Discussed treatment and supportive care.  Results and differential diagnosis were discussed with the patient/parent/guardian. Close follow up outpatient was discussed, comfortable with the plan.   Medications - No data to display  Filed Vitals:   03/25/15 1509  BP: 138/79  Pulse: 114  Temp: 98.2 F (36.8 C)  TempSrc: Oral  Resp: 18  Height: 6'  (1.829 m)  Weight: 200 lb (90.719 kg)  SpO2: 99%    Final diagnoses:  Scabies  Rash and nonspecific skin eruption        Elnora Morrison, MD 03/25/15 1523

## 2015-03-25 NOTE — Discharge Instructions (Signed)
If you were given medicines take as directed.  If you are on coumadin or contraceptives realize their levels and effectiveness is altered by many different medicines.  If you have any reaction (rash, tongues swelling, other) to the medicines stop taking and see a physician.   Benadryl every 6 hrs for itching.  Wash all clothes, bedding and sheets in hot water.  If your blood pressure was elevated in the ER make sure you follow up for management with a primary doctor or return for chest pain, shortness of breath or stroke symptoms.  Please follow up as directed and return to the ER or see a physician for new or worsening symptoms.  Thank you. Filed Vitals:   03/25/15 1509  BP: 138/79  Pulse: 114  Temp: 98.2 F (36.8 C)  TempSrc: Oral  Resp: 18  Height: 6' (1.829 m)  Weight: 200 lb (90.719 kg)  SpO2: 99%

## 2015-03-25 NOTE — ED Notes (Signed)
EDP assessing pt in triage. 

## 2015-07-18 ENCOUNTER — Emergency Department (HOSPITAL_COMMUNITY)
Admission: EM | Admit: 2015-07-18 | Discharge: 2015-07-18 | Disposition: A | Discharge status: Home / Self Care | Payer: BLUE CROSS/BLUE SHIELD | Attending: Emergency Medicine | Admitting: Emergency Medicine

## 2015-07-18 ENCOUNTER — Encounter (HOSPITAL_COMMUNITY): Payer: Self-pay | Admitting: Emergency Medicine

## 2015-07-18 DIAGNOSIS — Z720 Tobacco use: Secondary | ICD-10-CM | POA: Diagnosis not present

## 2015-07-18 DIAGNOSIS — R509 Fever, unspecified: Secondary | ICD-10-CM | POA: Diagnosis present

## 2015-07-18 DIAGNOSIS — J029 Acute pharyngitis, unspecified: Secondary | ICD-10-CM | POA: Diagnosis not present

## 2015-07-18 LAB — COMPREHENSIVE METABOLIC PANEL
ALT: 19 U/L (ref 17–63)
AST: 19 U/L (ref 15–41)
Albumin: 4.1 g/dL (ref 3.5–5.0)
Alkaline Phosphatase: 103 U/L (ref 38–126)
Anion gap: 9 (ref 5–15)
BUN: 12 mg/dL (ref 6–20)
CO2: 25 mmol/L (ref 22–32)
Calcium: 9.1 mg/dL (ref 8.9–10.3)
Chloride: 103 mmol/L (ref 101–111)
Creatinine, Ser: 0.9 mg/dL (ref 0.61–1.24)
GFR calc Af Amer: >60 mL/min (ref >60)
Glucose, Bld: 98 mg/dL (ref 65–99)
Potassium: 4.6 mmol/L (ref 3.5–5.1)
Sodium: 137 mmol/L (ref 135–145)
Total Bilirubin: 0.7 mg/dL (ref 0.3–1.2)
Total Protein: 8.5 g/dL — ABNORMAL HIGH (ref 6.5–8.1)

## 2015-07-18 LAB — CBC
HCT: 48 % (ref 39.0–52.0)
Hemoglobin: 16.7 g/dL (ref 13.0–17.0)
MCH: 32.1 pg (ref 26.0–34.0)
MCHC: 34.8 g/dL (ref 30.0–36.0)
MCV: 92.1 fL (ref 78.0–100.0)
PLATELETS: 248 10*3/uL (ref 150–400)
RBC: 5.21 MIL/uL (ref 4.22–5.81)
RDW: 12.2 % (ref 11.5–15.5)
WBC: 6.3 10*3/uL (ref 4.0–10.5)

## 2015-07-18 LAB — MONONUCLEOSIS SCREEN: Mono Screen: NEGATIVE

## 2015-07-18 LAB — RAPID STREP SCREEN (MED CTR MEBANE ONLY): Streptococcus, Group A Screen (Direct): NEGATIVE

## 2015-07-18 MED ORDER — IBUPROFEN 800 MG PO TABS
800.0000 mg | ORAL_TABLET | Freq: Once | ORAL | Status: AC
  Administered 2015-07-18: 800 mg via ORAL
  Filled 2015-07-18 (×2): qty 1

## 2015-07-18 MED ORDER — AMOXICILLIN 500 MG PO CAPS: 500.0000 mg | ORAL_CAPSULE | Freq: Three times a day (TID) | ORAL | Status: DC

## 2015-07-18 NOTE — ED Notes (Signed)
PT c/o sore throat, fever, bodyaches and diarrhea x3 days. PT states he took tylenol at 1400 prior to ED arrival.

## 2015-07-18 NOTE — Discharge Instructions (Signed)
Tylenol and motrin for pain and fever.  Drink plenty of fluids and follow up with your md next week if not improving

## 2015-07-18 NOTE — ED Provider Notes (Signed)
CSN: [id number]     Arrival date & time 07/18/15  1431 History   First MD Initiated Contact with Patient 07/18/15 1653     Chief Complaint  Patient presents with  . Fever     (Consider location/radiation/quality/duration/timing/severity/associated sxs/prior Treatment) Patient is a 29 y.o. male presenting with fever. The history is provided by the patient (Patient complains of a sore throat fever and aches for a couple days now.).  Fever Temp source:  Oral Severity:  Moderate Onset quality:  Sudden Timing:  Constant Progression:  Waxing and waning Chronicity:  New Relieved by:  Nothing Associated symptoms: no chest pain, no congestion, no cough, no diarrhea, no headaches and no rash     History reviewed. No pertinent past medical history. History reviewed. No pertinent past surgical history. Family History  Problem Relation Age of Onset  . Diabetes Mother   . Thyroid disease Mother   . COPD Father    Social History  Substance Use Topics  . Smoking status: Current Every Day Smoker -- 0.50 packs/day    Types: Cigarettes  . Smokeless tobacco: None  . Alcohol Use: Yes     Comment: occasional    Review of Systems  Constitutional: Positive for fever. Negative for appetite change and fatigue.  HENT: Negative for congestion, ear discharge and sinus pressure.        Sore throat  Eyes: Negative for discharge.  Respiratory: Negative for cough.   Cardiovascular: Negative for chest pain.  Gastrointestinal: Negative for abdominal pain and diarrhea.  Genitourinary: Negative for frequency and hematuria.  Musculoskeletal: Negative for back pain.  Skin: Negative for rash.  Neurological: Negative for seizures and headaches.  Psychiatric/Behavioral: Negative for hallucinations.      Allergies  Review of patient's allergies indicates no known allergies.  Home Medications   Prior to Admission medications   Medication Sig Start Date End Date Taking? Authorizing Shammond Arave   DM-Doxylamine-Acetaminophen (NYQUIL COLD & FLU PO) Take 30 mLs by mouth daily as needed (FOR COLD/FLU).   Yes Historical Marvelene Stoneberg, MD  Pseudoephedrine-DM-GG-APAP (TYLENOL COLD) 30-15-200-325 MG TABS Take 2 capsules by mouth daily as needed (FOR COLD/FLU).   Yes Historical Clea Dubach, MD  amoxicillin (AMOXIL) 500 MG capsule Take 1 capsule (500 mg total) by mouth 3 (three) times daily. 07/18/15   Milton Ferguson, MD  nitroGLYCERIN (NITROSTAT) 0.4 MG SL tablet Place 0.4 mg under the tongue every 5 (five) minutes as needed for chest pain.    Historical Avon Molock, MD   BP 115/77 mmHg  Pulse 83  Temp(Src) 98.3 F (36.8 C) (Oral)  Resp 16  Ht 5\' 11"  (1.803 m)  Wt 200 lb (90.719 kg)  BMI 27.91 kg/m2  SpO2 98% Physical Exam  Constitutional: He is oriented to person, place, and time. He appears well-developed.  HENT:  Head: Normocephalic.  Pharynx inflamed.  Tender neck nodes bilaterally  Eyes: Conjunctivae and EOM are normal. No scleral icterus.  Neck: Neck supple. No thyromegaly present.  Cardiovascular: Normal rate and regular rhythm.  Exam reveals no gallop and no friction rub.   No murmur heard. Pulmonary/Chest: No stridor. He has no wheezes. He has no rales. He exhibits no tenderness.  Abdominal: He exhibits no distension. There is no tenderness. There is no rebound.  Musculoskeletal: Normal range of motion. He exhibits no edema.  Lymphadenopathy:    He has no cervical adenopathy.  Neurological: He is oriented to person, place, and time. He exhibits normal muscle tone. Coordination normal.  Skin: No rash noted.  No erythema.  Psychiatric: He has a normal mood and affect. His behavior is normal.    ED Course  Procedures (including critical care time) Labs Review Labs Reviewed  COMPREHENSIVE METABOLIC PANEL - Abnormal; Notable for the following:    Total Protein 8.5 (*)    All other components within normal limits  RAPID STREP SCREEN (NOT AT [facility])  CULTURE, GROUP A STREP  CBC   MONONUCLEOSIS SCREEN    Imaging Review No results found. I have personally reviewed and evaluated these images and lab results as part of my medical decision-making.   EKG Interpretation None      MDM   Final diagnoses:  Pharyngitis    Pharyngitis rx amox and follow up pcp    Milton Ferguson, MD 07/18/15 2104

## 2015-07-20 LAB — CULTURE, GROUP A STREP: STREP A CULTURE: NEGATIVE

## 2015-08-20 ENCOUNTER — Emergency Department (HOSPITAL_COMMUNITY)
Admission: EM | Admit: 2015-08-20 | Discharge: 2015-08-20 | Disposition: A | Discharge status: Home / Self Care | Payer: BLUE CROSS/BLUE SHIELD | Attending: Emergency Medicine | Admitting: Emergency Medicine

## 2015-08-20 ENCOUNTER — Encounter (HOSPITAL_COMMUNITY): Payer: Self-pay

## 2015-08-20 DIAGNOSIS — L0231 Cutaneous abscess of buttock: Secondary | ICD-10-CM | POA: Diagnosis not present

## 2015-08-20 DIAGNOSIS — Z792 Long term (current) use of antibiotics: Secondary | ICD-10-CM | POA: Diagnosis not present

## 2015-08-20 DIAGNOSIS — L02415 Cutaneous abscess of right lower limb: Secondary | ICD-10-CM | POA: Insufficient documentation

## 2015-08-20 DIAGNOSIS — Z720 Tobacco use: Secondary | ICD-10-CM | POA: Insufficient documentation

## 2015-08-20 MED ORDER — IBUPROFEN 800 MG PO TABS: 800.0000 mg | ORAL_TABLET | Freq: Three times a day (TID) | ORAL | Status: DC

## 2015-08-20 MED ORDER — IBUPROFEN 800 MG PO TABS
800.0000 mg | ORAL_TABLET | Freq: Once | ORAL | Status: AC
  Administered 2015-08-20: 800 mg via ORAL
  Filled 2015-08-20: qty 1

## 2015-08-20 MED ORDER — DOXYCYCLINE HYCLATE 100 MG PO CAPS: 100.0000 mg | ORAL_CAPSULE | Freq: Two times a day (BID) | ORAL | Status: DC

## 2015-08-20 MED ORDER — LIDOCAINE HCL (PF) 1 % IJ SOLN
INTRAMUSCULAR | Status: DC
Start: 2015-08-20 — End: 2015-08-21
  Filled 2015-08-20: qty 5

## 2015-08-20 MED ORDER — ACETAMINOPHEN 325 MG PO TABS
650.0000 mg | ORAL_TABLET | Freq: Once | ORAL | Status: AC
  Administered 2015-08-20: 650 mg via ORAL
  Filled 2015-08-20: qty 2

## 2015-08-20 MED ORDER — CEFTRIAXONE SODIUM 1 G IJ SOLR
1.0000 g | Freq: Once | INTRAMUSCULAR | Status: AC
  Administered 2015-08-20: 1 g via INTRAMUSCULAR
  Filled 2015-08-20: qty 10

## 2015-08-20 MED ORDER — DOXYCYCLINE HYCLATE 100 MG PO TABS
100.0000 mg | ORAL_TABLET | Freq: Once | ORAL | Status: AC
  Administered 2015-08-20: 100 mg via ORAL
  Filled 2015-08-20: qty 1

## 2015-08-20 MED ORDER — HYDROCODONE-ACETAMINOPHEN 5-325 MG PO TABS: 1.0000 | ORAL_TABLET | ORAL | Status: DC | PRN

## 2015-08-20 NOTE — ED Notes (Signed)
Feels like the size of a lemon under the skin, has been oozing some per pt.

## 2015-08-20 NOTE — Discharge Instructions (Signed)
Please use warm tub soaks 2 times daily for 15-20 minutes each. Doxycycline 2 times daily and ibuprofen 3 times daily until all taken. May use Norco for severe pain. Norco may cause drowsiness, please use this medication with caution. Please do not drink alcohol, drive, operate machinery, for 2 separate in activities requiring concentration when taking this medication. Please have the abscess area reassessed if not improving in the next 3-4 days. Abscess An abscess is an infected area that contains a collection of pus and debris.It can occur in almost any part of the body. An abscess is also known as a furuncle or boil. CAUSES  An abscess occurs when tissue gets infected. This can occur from blockage of oil or sweat glands, infection of hair follicles, or a minor injury to the skin. As the body tries to fight the infection, pus collects in the area and creates pressure under the skin. This pressure causes pain. People with weakened immune systems have difficulty fighting infections and get certain abscesses more often.  SYMPTOMS Usually an abscess develops on the skin and becomes a painful mass that is red, warm, and tender. If the abscess forms under the skin, you may feel a moveable soft area under the skin. Some abscesses break open (rupture) on their own, but most will continue to get worse without care. The infection can spread deeper into the body and eventually into the bloodstream, causing you to feel ill.  DIAGNOSIS  Your caregiver will take your medical history and perform a physical exam. A sample of fluid may also be taken from the abscess to determine what is causing your infection. TREATMENT  Your caregiver may prescribe antibiotic medicines to fight the infection. However, taking antibiotics alone usually does not cure an abscess. Your caregiver may need to make a small cut (incision) in the abscess to drain the pus. In some cases, gauze is packed into the abscess to reduce pain and to  continue draining the area. HOME CARE INSTRUCTIONS   Only take over-the-counter or prescription medicines for pain, discomfort, or fever as directed by your caregiver.  If you were prescribed antibiotics, take them as directed. Finish them even if you start to feel better.  If gauze is used, follow your caregiver's directions for changing the gauze.  To avoid spreading the infection:  Keep your draining abscess covered with a bandage.  Wash your hands well.  Do not share personal care items, towels, or whirlpools with others.  Avoid skin contact with others.  Keep your skin and clothes clean around the abscess.  Keep all follow-up appointments as directed by your caregiver. SEEK MEDICAL CARE IF:   You have increased pain, swelling, redness, fluid drainage, or bleeding.  You have muscle aches, chills, or a general ill feeling.  You have a fever. MAKE SURE YOU:   Understand these instructions.  Will watch your condition.  Will get help right away if you are not doing well or get worse.   This information is not intended to replace advice given to you by your health care provider. Make sure you discuss any questions you have with your health care provider.   Document Released: 07/18/2005 Document Revised: 04/08/2012 Document Reviewed: 12/21/2011 Elsevier Interactive Patient Education [employer].

## 2015-08-20 NOTE — ED Provider Notes (Signed)
CSN: [id number]     Arrival date & time 08/20/15  2005 History   First MD Initiated Contact with Patient 08/20/15 2211     Chief Complaint  Patient presents with  . Abscess     (Consider location/radiation/quality/duration/timing/severity/associated sxs/prior Treatment) Patient is a 29 y.o. male presenting with abscess. The history is provided by the patient.  Abscess Location:  Ano-genital Ano-genital abscess location:  R buttock Abscess quality: painful, redness and warmth   Progression:  Partially resolved Pain details:    Quality:  Aching   Duration:  1 day   Timing:  Intermittent   Progression:  Worsening Chronicity:  New Context: not diabetes   Relieved by:  Nothing Ineffective treatments:  Aspirin Associated symptoms: no fever and no nausea   Risk factors: no prior abscess     History reviewed. No pertinent past medical history. History reviewed. No pertinent past surgical history. Family History  Problem Relation Age of Onset  . Diabetes Mother   . Thyroid disease Mother   . COPD Father    Social History  Substance Use Topics  . Smoking status: Current Every Day Smoker -- 0.50 packs/day    Types: Cigarettes  . Smokeless tobacco: None  . Alcohol Use: Yes     Comment: occasional    Review of Systems  Constitutional: Negative for fever.  Gastrointestinal: Negative for nausea.  Skin:       Abscess  All other systems reviewed and are negative.     Allergies  Review of patient's allergies indicates no known allergies.  Home Medications   Prior to Admission medications   Medication Sig Start Date End Date Taking? Authorizing Provider  amoxicillin (AMOXIL) 500 MG capsule Take 1 capsule (500 mg total) by mouth 3 (three) times daily. 07/18/15   Milton Ferguson, MD  DM-Doxylamine-Acetaminophen (NYQUIL COLD & FLU PO) Take 30 mLs by mouth daily as needed (FOR COLD/FLU).    Historical Provider, MD  doxycycline (VIBRAMYCIN) 100 MG capsule Take 1 capsule (100 mg  total) by mouth 2 (two) times daily. 08/20/15   Lily Kocher, PA-C  HYDROcodone-acetaminophen (NORCO/VICODIN) 5-325 MG tablet Take 1 tablet by mouth every 4 (four) hours as needed. 08/20/15   Lily Kocher, PA-C  ibuprofen (ADVIL,MOTRIN) 800 MG tablet Take 1 tablet (800 mg total) by mouth 3 (three) times daily. 08/20/15   Lily Kocher, PA-C  nitroGLYCERIN (NITROSTAT) 0.4 MG SL tablet Place 0.4 mg under the tongue every 5 (five) minutes as needed for chest pain.    Historical Provider, MD  Pseudoephedrine-DM-GG-APAP (TYLENOL COLD) 30-15-200-325 MG TABS Take 2 capsules by mouth daily as needed (FOR COLD/FLU).    Historical Provider, MD   BP 131/70 mmHg  Pulse 109  Temp(Src) 98.9 F (37.2 C) (Oral)  Resp 20  Ht 5\' 11"  (1.803 m)  Wt 193 lb (87.544 kg)  BMI 26.93 kg/m2  SpO2 100% Physical Exam  Constitutional: He is oriented to person, place, and time. He appears well-developed and well-nourished.  Non-toxic appearance.  HENT:  Head: Normocephalic.  Right Ear: Tympanic membrane and external ear normal.  Left Ear: Tympanic membrane and external ear normal.  Eyes: EOM and lids are normal. Pupils are equal, round, and reactive to light.  Neck: Normal range of motion. Neck supple. Carotid bruit is not present.  Cardiovascular: Normal rate, regular rhythm, normal heart sounds, intact distal pulses and normal pulses.   Pulmonary/Chest: Breath sounds normal. No respiratory distress.  Abdominal: Soft. Bowel sounds are normal. There is no tenderness.  There is no guarding.  Musculoskeletal: Normal range of motion.  2 warm , red abscess areas noted on the right buttocks, and upper right posterior thigh. No red streaking noted. The area is warm but not hot.  Lymphadenopathy:       Head (right side): No submandibular adenopathy present.       Head (left side): No submandibular adenopathy present.    He has no cervical adenopathy.  Neurological: He is alert and oriented to person, place, and time. He  has normal strength. No cranial nerve deficit or sensory deficit.  Skin: Skin is warm and dry.  Psychiatric: He has a normal mood and affect. His speech is normal.  Nursing note and vitals reviewed.   ED Course  Procedures (including critical care time) Labs Review Labs Reviewed - No data to display  Imaging Review No results found. I have personally reviewed and evaluated these images and lab results as part of my medical decision-making.   EKG Interpretation None      MDM  Vital signs reviewed. Patient has an abscess of the right buttocks. This is not a candidate for incision and drainage at this time. There no red streaks appreciated. No temperature elevation at this time.  I have instructed the patient to use warm tub soaks 2 times daily, to use doxycycline, ibuprofen, and Norco. Patient is to have the area reevaluated on November 3 if not improving.    Final diagnoses:  Abscess of buttock, right    **I have reviewed nursing notes, vital signs, and all appropriate lab and imaging results for this patient.Lily Kocher, PA-C 08/20/15 8891  Daleen Bo, MD 08/21/15 [phone]

## 2015-08-20 NOTE — ED Notes (Signed)
I have an abscess on my right buttock per pt.  The swelling has doubled in the past day and that is my concern per pt.

## 2016-02-27 ENCOUNTER — Encounter (HOSPITAL_COMMUNITY): Payer: Self-pay | Admitting: Emergency Medicine

## 2016-02-27 ENCOUNTER — Emergency Department (HOSPITAL_COMMUNITY)
Admission: EM | Admit: 2016-02-27 | Discharge: 2016-02-28 | Disposition: A | Discharge status: Home / Self Care | Payer: BLUE CROSS/BLUE SHIELD | Attending: Emergency Medicine | Admitting: Emergency Medicine

## 2016-02-27 DIAGNOSIS — R1011 Right upper quadrant pain: Secondary | ICD-10-CM | POA: Diagnosis not present

## 2016-02-27 DIAGNOSIS — R197 Diarrhea, unspecified: Secondary | ICD-10-CM

## 2016-02-27 DIAGNOSIS — F17210 Nicotine dependence, cigarettes, uncomplicated: Secondary | ICD-10-CM | POA: Diagnosis not present

## 2016-02-27 DIAGNOSIS — R110 Nausea: Secondary | ICD-10-CM | POA: Diagnosis not present

## 2016-02-27 LAB — URINALYSIS, ROUTINE W REFLEX MICROSCOPIC
BILIRUBIN URINE: NEGATIVE
Glucose, UA: NEGATIVE mg/dL
Hgb urine dipstick: NEGATIVE
Leukocytes, UA: NEGATIVE
Nitrite: NEGATIVE
PROTEIN: NEGATIVE mg/dL
Specific Gravity, Urine: 1.02 (ref 1.005–1.030)
pH: 6.5 (ref 5.0–8.0)

## 2016-02-27 LAB — COMPREHENSIVE METABOLIC PANEL
ALK PHOS: 81 U/L (ref 38–126)
ALT: 20 U/L (ref 17–63)
ANION GAP: 6 (ref 5–15)
AST: 25 U/L (ref 15–41)
Albumin: 4.5 g/dL (ref 3.5–5.0)
BUN: 10 mg/dL (ref 6–20)
CALCIUM: 9.5 mg/dL (ref 8.9–10.3)
CO2: 30 mmol/L (ref 22–32)
Chloride: 104 mmol/L (ref 101–111)
Creatinine, Ser: 1.04 mg/dL (ref 0.61–1.24)
GFR calc Af Amer: >60 mL/min (ref >60)
GFR calc non Af Amer: >60 mL/min (ref >60)
GLUCOSE: 107 mg/dL — AB (ref 65–99)
Potassium: 3.9 mmol/L (ref 3.5–5.1)
Sodium: 140 mmol/L (ref 135–145)
TOTAL PROTEIN: 8.1 g/dL (ref 6.5–8.1)
Total Bilirubin: 0.7 mg/dL (ref 0.3–1.2)

## 2016-02-27 LAB — CBC
HCT: 46 % (ref 39.0–52.0)
Hemoglobin: 15.8 g/dL (ref 13.0–17.0)
MCH: 31.9 pg (ref 26.0–34.0)
MCHC: 34.3 g/dL (ref 30.0–36.0)
MCV: 92.7 fL (ref 78.0–100.0)
Platelets: 277 10*3/uL (ref 150–400)
RBC: 4.96 MIL/uL (ref 4.22–5.81)
RDW: 12.3 % (ref 11.5–15.5)
WBC: 6.3 10*3/uL (ref 4.0–10.5)

## 2016-02-27 LAB — LIPASE, BLOOD: Lipase: 39 U/L (ref 11–51)

## 2016-02-27 NOTE — ED Notes (Signed)
Patient complaining of abdominal pain and diarrhea since yesterday. Denies vomiting. States "the diarrhea is a little better today but I feel like I have no energy."

## 2016-02-28 MED ORDER — ONDANSETRON 8 MG PO TBDP
8.0000 mg | ORAL_TABLET | Freq: Once | ORAL | Status: AC
  Administered 2016-02-28: 8 mg via ORAL
  Filled 2016-02-28: qty 1

## 2016-02-28 MED ORDER — ONDANSETRON HCL 4 MG PO TABS: 4.0000 mg | ORAL_TABLET | Freq: Three times a day (TID) | ORAL | Status: DC | PRN

## 2016-02-28 NOTE — ED Notes (Signed)
Pt alert & oriented x4, stable gait. Patient given discharge instructions, paperwork & prescription(s). Patient  instructed to stop at the registration desk to finish any additional paperwork. Patient verbalized understanding. Pt left department w/ no further questions. 

## 2016-02-28 NOTE — ED Provider Notes (Signed)
CSN: [id number]     Arrival date & time 02/27/16  2006 History  By signing my name below, I, Hansel Feinstein, attest that this documentation has been prepared under the direction and in the presence of Rolland Porter, MD at 00:30 AM. Electronically Signed: Hansel Feinstein, ED Scribe. 02/28/2016. 12:39 AM.    Chief Complaint  Patient presents with  . Abdominal Pain  . Diarrhea   The history is provided by the patient. No language interpreter was used.    HPI Comments: Darryl Reed is a 30 y.o. male who presents to the Emergency Department complaining of moderate RUQ abdominal cramping with radiation to the upper back onset yesterday with associated nausea, diarrhea (x10 yesterday). Pt states his diarrhea is watery and has decreased in frequency today. He states his abdominal discomfort has also improved today, and was worsened with BMs. He states he has been able to tolerate food and fluids today. No known sick contacts with similar symptoms. No known suspicious food intake. Pt states the last thing he ate before onset of symptoms was frozen pizza. He states he has not noticed any post-prandial pain in the last month. Pt reports some orthostatic dizziness and lightheadedness yesterday, but denies any currently. Denies emesis, fever, bloody stools. Pt is a current smoker, 0.5 ppd.    PCP- Mickie Hillier, MD    History reviewed. No pertinent past medical history. History reviewed. No pertinent past surgical history. Family History  Problem Relation Age of Onset  . Diabetes Mother   . Thyroid disease Mother   . COPD Father    Social History  Substance Use Topics  . Smoking status: Current Every Day Smoker -- 0.50 packs/day    Types: Cigarettes  . Smokeless tobacco: None  . Alcohol Use: Yes     Comment: occasional  employed  Review of Systems  Constitutional: Negative for fever.  Gastrointestinal: Positive for nausea, abdominal pain (cramping) and diarrhea. Negative for vomiting and blood in stool.   Neurological: Negative for dizziness and light-headedness.  All other systems reviewed and are negative.  Allergies  Review of patient's allergies indicates no known allergies.  Home Medications   Prior to Admission medications   Medication Sig Start Date End Date Taking? Authorizing Provider  amoxicillin (AMOXIL) 500 MG capsule Take 1 capsule (500 mg total) by mouth 3 (three) times daily. 07/18/15   Milton Ferguson, MD  DM-Doxylamine-Acetaminophen (NYQUIL COLD & FLU PO) Take 30 mLs by mouth daily as needed (FOR COLD/FLU).    Historical Provider, MD  doxycycline (VIBRAMYCIN) 100 MG capsule Take 1 capsule (100 mg total) by mouth 2 (two) times daily. 08/20/15   Lily Kocher, PA-C  HYDROcodone-acetaminophen (NORCO/VICODIN) 5-325 MG tablet Take 1 tablet by mouth every 4 (four) hours as needed. 08/20/15   Lily Kocher, PA-C  ibuprofen (ADVIL,MOTRIN) 800 MG tablet Take 1 tablet (800 mg total) by mouth 3 (three) times daily. 08/20/15   Lily Kocher, PA-C  nitroGLYCERIN (NITROSTAT) 0.4 MG SL tablet Place 0.4 mg under the tongue every 5 (five) minutes as needed for chest pain.    Historical Provider, MD  ondansetron (ZOFRAN) 4 MG tablet Take 1 tablet (4 mg total) by mouth every 8 (eight) hours as needed for nausea or vomiting. 02/28/16   Rolland Porter, MD  Pseudoephedrine-DM-GG-APAP (TYLENOL COLD) 30-15-200-325 MG TABS Take 2 capsules by mouth daily as needed (FOR COLD/FLU).    Historical Provider, MD   BP 123/72 mmHg  Pulse 72  Temp(Src) 98.2 F (36.8 C) (Oral)  Resp 16  Ht 5\' 11"  (1.803 m)  Wt 185 lb (83.915 kg)  BMI 25.81 kg/m2  SpO2 98%  Vital signs normal   Physical Exam  Constitutional: He is oriented to person, place, and time. He appears well-developed and well-nourished.  Non-toxic appearance. He does not appear ill. No distress.  HENT:  Head: Normocephalic and atraumatic.  Right Ear: External ear normal.  Left Ear: External ear normal.  Nose: Nose normal. No mucosal edema or  rhinorrhea.  Mouth/Throat: Oropharynx is clear and moist and mucous membranes are normal. No dental abscesses or uvula swelling.  Eyes: Conjunctivae and EOM are normal. Pupils are equal, round, and reactive to light.  Neck: Normal range of motion and full passive range of motion without pain. Neck supple.  Cardiovascular: Normal rate, regular rhythm and normal heart sounds.  Exam reveals no gallop and no friction rub.   No murmur heard. Pulmonary/Chest: Effort normal and breath sounds normal. No respiratory distress. He has no wheezes. He has no rhonchi. He has no rales. He exhibits no tenderness and no crepitus.  Abdominal: Soft. Normal appearance and bowel sounds are normal. He exhibits no distension. There is tenderness. There is no rebound and no guarding.  RUQ abdominal tenderness  Musculoskeletal: Normal range of motion. He exhibits no edema or tenderness.  Moves all extremities well.   Neurological: He is alert and oriented to person, place, and time. He has normal strength. No cranial nerve deficit.  Skin: Skin is warm, dry and intact. No rash noted. No erythema. No pallor.  Psychiatric: He has a normal mood and affect. His speech is normal and behavior is normal. His mood appears not anxious.  Nursing note and vitals reviewed.   ED Course  Procedures (including critical care time) Medications  ondansetron (ZOFRAN-ODT) disintegrating tablet 8 mg (8 mg Oral Given 02/28/16 0050)    DIAGNOSTIC STUDIES: Oxygen Saturation is 98% on RA, normal by my interpretation.    COORDINATION OF CARE: 12:31 AM Discussed Patient's laboratory results and possible treatment plan with pt at bedside We discussed getting IV fluids and a CT scan tonight to further evaluate his RUQ pain, but he prefers to take nausea medicine and drink fluids at home. He states he will follow-up with his primary care doctor if he continues to have the pain. Since patient is afebrile and his laboratory results are normal this  was felt to be reasonable.   Labs Review Results for orders placed or performed during the hospital encounter of 02/27/16  Lipase, blood  Result Value Ref Range   Lipase 39 11 - 51 U/L  Comprehensive metabolic panel  Result Value Ref Range   Sodium 140 135 - 145 mmol/L   Potassium 3.9 3.5 - 5.1 mmol/L   Chloride 104 101 - 111 mmol/L   CO2 30 22 - 32 mmol/L   Glucose, Bld 107 (H) 65 - 99 mg/dL   BUN 10 6 - 20 mg/dL   Creatinine, Ser 1.04 0.61 - 1.24 mg/dL   Calcium 9.5 8.9 - 10.3 mg/dL   Total Protein 8.1 6.5 - 8.1 g/dL   Albumin 4.5 3.5 - 5.0 g/dL   AST 25 15 - 41 U/L   ALT 20 17 - 63 U/L   Alkaline Phosphatase 81 38 - 126 U/L   Total Bilirubin 0.7 0.3 - 1.2 mg/dL   GFR calc non Af Amer >60 >60 mL/min   GFR calc Af Amer >60 >60 mL/min   Anion gap 6 5 -  15  CBC  Result Value Ref Range   WBC 6.3 4.0 - 10.5 K/uL   RBC 4.96 4.22 - 5.81 MIL/uL   Hemoglobin 15.8 13.0 - 17.0 g/dL   HCT 46.0 39.0 - 52.0 %   MCV 92.7 78.0 - 100.0 fL   MCH 31.9 26.0 - 34.0 pg   MCHC 34.3 30.0 - 36.0 g/dL   RDW 12.3 11.5 - 15.5 %   Platelets 277 150 - 400 K/uL  Urinalysis, Routine w reflex microscopic  Result Value Ref Range   Color, Urine YELLOW YELLOW   APPearance CLEAR CLEAR   Specific Gravity, Urine 1.020 1.005 - 1.030   pH 6.5 5.0 - 8.0   Glucose, UA NEGATIVE NEGATIVE mg/dL   Hgb urine dipstick NEGATIVE NEGATIVE   Bilirubin Urine NEGATIVE NEGATIVE   Ketones, ur TRACE (A) NEGATIVE mg/dL   Protein, ur NEGATIVE NEGATIVE mg/dL   Nitrite NEGATIVE NEGATIVE   Leukocytes, UA NEGATIVE NEGATIVE   Laboratory interpretation all normal     I have personally reviewed and evaluated these lab results as part of my medical decision-making.    MDM   Final diagnoses:  Right upper quadrant pain  Diarrhea, unspecified type  Nausea   Discharge Medication List as of 02/28/2016  1:13 AM    START taking these medications   Details  ondansetron (ZOFRAN) 4 MG tablet Take 1 tablet (4 mg total) by  mouth every 8 (eight) hours as needed for nausea or vomiting., Starting 02/28/2016, Until Discontinued, Print        Plan discharge  Rolland Porter, MD, FACEP  I personally performed the services described in this documentation, which was scribed in my presence. The recorded information has been reviewed and considered.  Rolland Porter, MD, Barbette Or, MD 02/28/16 [phone]

## 2016-02-28 NOTE — Discharge Instructions (Signed)
Drink plenty of fluids (clear liquids) the next 12-24 hours then start a bland diet such as toast, crackers, Jell-O, or [employer] chicken noodle soup. Use the zofran f Diarrhea Diarrhea is frequent loose and watery bowel movements. It can cause you to feel weak and dehydrated. Dehydration can cause you to become tired and thirsty, have a dry mouth, and have decreased urination that often is dark yellow. Diarrhea is a sign of another problem, most often an infection that will not last long. In most cases, diarrhea typically lasts 2-3 days. However, it can last longer if it is a sign of something more serious. It is important to treat your diarrhea as directed by your caregiver to lessen or prevent future episodes of diarrhea. CAUSES  Some common causes include:  Gastrointestinal infections caused by viruses, bacteria, or parasites.  Food poisoning or food allergies.  Certain medicines, such as antibiotics, chemotherapy, and laxatives.  Artificial sweeteners and fructose.  Digestive disorders. HOME CARE INSTRUCTIONS  Ensure adequate fluid intake (hydration): Have 1 cup (8 oz) of fluid for each diarrhea episode. Avoid fluids that contain simple sugars or sports drinks, fruit juices, whole milk products, and sodas. Your urine should be clear or pale yellow if you are drinking enough fluids. Hydrate with an oral rehydration solution that you can purchase at pharmacies, retail stores, and online. You can prepare an oral rehydration solution at home by mixing the following ingredients together:   - tsp table salt.   tsp baking soda.   tsp salt substitute containing potassium chloride.  1  tablespoons sugar.  1 L (34 oz) of water.  Certain foods and beverages may increase the speed at which food moves through the gastrointestinal (GI) tract. These foods and beverages should be avoided and include:  Caffeinated and alcoholic beverages.  High-fiber foods, such as raw fruits and vegetables,  nuts, seeds, and whole grain breads and cereals.  Foods and beverages sweetened with sugar alcohols, such as xylitol, sorbitol, and mannitol.  Some foods may be well tolerated and may help thicken stool including:  Starchy foods, such as rice, toast, pasta, low-sugar cereal, oatmeal, grits, baked potatoes, crackers, and bagels.  Bananas.  Applesauce.  Add probiotic-rich foods to help increase healthy bacteria in the GI tract, such as yogurt and fermented milk products.  Wash your hands well after each diarrhea episode.  Only take over-the-counter or prescription medicines as directed by your caregiver.  Take a warm bath to relieve any burning or pain from frequent diarrhea episodes. SEEK IMMEDIATE MEDICAL CARE IF:   You are unable to keep fluids down.  You have persistent vomiting.  You have blood in your stool, or your stools are black and tarry.  You do not urinate in 6-8 hours, or there is only a small amount of very dark urine.  You have abdominal pain that increases or localizes.  You have weakness, dizziness, confusion, or light-headedness.  You have a severe headache.  Your diarrhea gets worse or does not get better.  You have a fever or persistent symptoms for more than 2-3 days.  You have a fever and your symptoms suddenly get worse. MAKE SURE YOU:   Understand these instructions.  Will watch your condition.  Will get help right away if you are not doing well or get worse.   This information is not intended to replace advice given to you by your health care provider. Make sure you discuss any questions you have with your health care provider.  Document Released: 09/28/2002 Document Revised: 10/29/2014 Document Reviewed: 06/15/2012 Elsevier Interactive Patient Education [employer]. or nausea or vomiting. Take imodium OTC for diarrhea. Avoid mild products until the diarrhea is gone. Recheck if you get worse again. Follow-up with Dr. Wolfgang Phoenix if you  continue to have the right abdominal pain or if your diarrhea doesn't go away.    Nausea, Adult Nausea means you feel sick to your stomach or need to throw up (vomit). It may be a sign of a more serious problem. If nausea gets worse, you may throw up. If you throw up a lot, you may lose too much body fluid (dehydration). HOME CARE   Get plenty of rest.  Ask your doctor how to replace body fluid losses (rehydrate).  Eat small amounts of food. Sip liquids more often.  Take all medicines as told by your doctor. GET HELP RIGHT AWAY IF:  You have a fever.  You pass out (faint).  You keep throwing up or have blood in your throw up.  You are very weak, have dry lips or a dry mouth, or you are very thirsty (dehydrated).  You have dark or bloody poop (stool).  You have very bad chest or belly (abdominal) pain.  You do not get better after 2 days, or you get worse.  You have a headache. MAKE SURE YOU:  Understand these instructions.  Will watch your condition.  Will get help right away if you are not doing well or get worse.   This information is not intended to replace advice given to you by your health care provider. Make sure you discuss any questions you have with your health care provider.   Document Released: 09/27/2011 Document Revised: 12/31/2011 Document Reviewed: 09/27/2011 Elsevier Interactive Patient Education [employer].

## 2016-05-02 DIAGNOSIS — Z008 Encounter for other general examination: Secondary | ICD-10-CM | POA: Diagnosis not present

## 2016-05-02 DIAGNOSIS — Z716 Tobacco abuse counseling: Secondary | ICD-10-CM | POA: Diagnosis not present

## 2016-05-02 DIAGNOSIS — F17210 Nicotine dependence, cigarettes, uncomplicated: Secondary | ICD-10-CM | POA: Diagnosis not present

## 2016-05-02 DIAGNOSIS — Z1389 Encounter for screening for other disorder: Secondary | ICD-10-CM | POA: Diagnosis not present

## 2016-12-06 DIAGNOSIS — R061 Stridor: Secondary | ICD-10-CM | POA: Diagnosis not present

## 2016-12-06 DIAGNOSIS — R000 Tachycardia, unspecified: Secondary | ICD-10-CM | POA: Diagnosis not present

## 2016-12-31 DIAGNOSIS — F17210 Nicotine dependence, cigarettes, uncomplicated: Secondary | ICD-10-CM | POA: Diagnosis not present

## 2016-12-31 DIAGNOSIS — Z716 Tobacco abuse counseling: Secondary | ICD-10-CM | POA: Diagnosis not present

## 2017-01-02 DIAGNOSIS — F17210 Nicotine dependence, cigarettes, uncomplicated: Secondary | ICD-10-CM | POA: Diagnosis not present

## 2017-01-02 DIAGNOSIS — Z716 Tobacco abuse counseling: Secondary | ICD-10-CM | POA: Diagnosis not present

## 2017-01-16 DIAGNOSIS — Z716 Tobacco abuse counseling: Secondary | ICD-10-CM | POA: Diagnosis not present

## 2017-01-16 DIAGNOSIS — F17210 Nicotine dependence, cigarettes, uncomplicated: Secondary | ICD-10-CM | POA: Diagnosis not present

## 2017-03-29 ENCOUNTER — Encounter (HOSPITAL_COMMUNITY): Payer: Self-pay

## 2017-03-29 ENCOUNTER — Emergency Department (HOSPITAL_COMMUNITY)
Admission: EM | Admit: 2017-03-29 | Discharge: 2017-03-29 | Disposition: A | Discharge status: Home / Self Care | Payer: BLUE CROSS/BLUE SHIELD | Attending: Emergency Medicine | Admitting: Emergency Medicine

## 2017-03-29 DIAGNOSIS — R109 Unspecified abdominal pain: Secondary | ICD-10-CM | POA: Diagnosis not present

## 2017-03-29 DIAGNOSIS — F1210 Cannabis abuse, uncomplicated: Secondary | ICD-10-CM | POA: Diagnosis not present

## 2017-03-29 DIAGNOSIS — F17210 Nicotine dependence, cigarettes, uncomplicated: Secondary | ICD-10-CM | POA: Insufficient documentation

## 2017-03-29 DIAGNOSIS — F1290 Cannabis use, unspecified, uncomplicated: Secondary | ICD-10-CM | POA: Insufficient documentation

## 2017-03-29 DIAGNOSIS — R200 Anesthesia of skin: Secondary | ICD-10-CM | POA: Diagnosis not present

## 2017-03-29 LAB — URINALYSIS, ROUTINE W REFLEX MICROSCOPIC
BILIRUBIN URINE: NEGATIVE
Glucose, UA: NEGATIVE mg/dL
Hgb urine dipstick: NEGATIVE
Ketones, ur: NEGATIVE mg/dL
Leukocytes, UA: NEGATIVE
NITRITE: NEGATIVE
PH: 6 (ref 5.0–8.0)
Protein, ur: NEGATIVE mg/dL
SPECIFIC GRAVITY, URINE: 1.015 (ref 1.005–1.030)

## 2017-03-29 LAB — CBC WITH DIFFERENTIAL/PLATELET
Basophils Absolute: 0 10*3/uL (ref 0.0–0.1)
Basophils Relative: 0 %
Eosinophils Absolute: 0.1 10*3/uL (ref 0.0–0.7)
Eosinophils Relative: 1 %
HEMATOCRIT: 41.7 % (ref 39.0–52.0)
Hemoglobin: 14.5 g/dL (ref 13.0–17.0)
LYMPHS PCT: 28 %
Lymphs Abs: 2.1 10*3/uL (ref 0.7–4.0)
MCH: 31.1 pg (ref 26.0–34.0)
MCHC: 34.8 g/dL (ref 30.0–36.0)
MCV: 89.5 fL (ref 78.0–100.0)
MONOS PCT: 8 %
Monocytes Absolute: 0.6 10*3/uL (ref 0.1–1.0)
NEUTROS ABS: 4.8 10*3/uL (ref 1.7–7.7)
NEUTROS PCT: 63 %
Platelets: 249 10*3/uL (ref 150–400)
RBC: 4.66 MIL/uL (ref 4.22–5.81)
RDW: 12.2 % (ref 11.5–15.5)
WBC: 7.7 10*3/uL (ref 4.0–10.5)

## 2017-03-29 LAB — COMPREHENSIVE METABOLIC PANEL
ALK PHOS: 86 U/L (ref 38–126)
ALT: 22 U/L (ref 17–63)
ANION GAP: 8 (ref 5–15)
AST: 26 U/L (ref 15–41)
Albumin: 4.2 g/dL (ref 3.5–5.0)
BUN: 15 mg/dL (ref 6–20)
CALCIUM: 8.8 mg/dL — AB (ref 8.9–10.3)
CO2: 24 mmol/L (ref 22–32)
CREATININE: 0.84 mg/dL (ref 0.61–1.24)
Chloride: 102 mmol/L (ref 101–111)
GFR calc non Af Amer: >60 mL/min (ref >60)
Glucose, Bld: 94 mg/dL (ref 65–99)
Potassium: 3.3 mmol/L — ABNORMAL LOW (ref 3.5–5.1)
Sodium: 134 mmol/L — ABNORMAL LOW (ref 135–145)
TOTAL PROTEIN: 7.2 g/dL (ref 6.5–8.1)
Total Bilirubin: 0.7 mg/dL (ref 0.3–1.2)

## 2017-03-29 LAB — RAPID URINE DRUG SCREEN, HOSP PERFORMED
Amphetamines: NOT DETECTED
BARBITURATES: NOT DETECTED
Benzodiazepines: NOT DETECTED
COCAINE: NOT DETECTED
OPIATES: NOT DETECTED
TETRAHYDROCANNABINOL: POSITIVE — AB

## 2017-03-29 LAB — LIPASE, BLOOD: Lipase: 41 U/L (ref 11–51)

## 2017-03-29 MED ORDER — POTASSIUM CHLORIDE CRYS ER 20 MEQ PO TBCR
40.0000 meq | EXTENDED_RELEASE_TABLET | Freq: Once | ORAL | Status: AC
  Administered 2017-03-29: 40 meq via ORAL
  Filled 2017-03-29: qty 2

## 2017-03-29 MED ORDER — DICYCLOMINE HCL 10 MG/ML IM SOLN
20.0000 mg | Freq: Once | INTRAMUSCULAR | Status: AC
  Administered 2017-03-29: 20 mg via INTRAMUSCULAR
  Filled 2017-03-29: qty 2

## 2017-03-29 NOTE — ED Triage Notes (Signed)
Pt was at work tonight when he had sudden onset of lower abd pain and states it was also putting pressure on his lower back, states he then started feeling some tingling and numbness to the back of his neck, tingling to his hands and feet.

## 2017-03-29 NOTE — Discharge Instructions (Signed)
your lab work is reassuring. Your potassium was mildly low. Stop using marijuana. Follow-up with Dr. Wolfgang Phoenix. Return to the ED if you develop new or worsening symptoms.

## 2017-03-29 NOTE — ED Provider Notes (Signed)
[place] DEPT Provider Note   CSN: [id number] Arrival date & time: 03/29/17  0143     History   Chief Complaint Chief Complaint  Patient presents with  . Abdominal Pain  . body tingling    HPI Darryl Reed is a 31 y.o. male.  Patient reports sudden onset of abdominal cramping and "hunger pains" while he was at work tonight around 8:30 PM. He thought he was hungry and try to eat something but this did not help. Denies any nausea or vomiting. Denies any diarrhea. Last bowel movement yesterday. Several hours later he developed bilateral tingling to his hands feet and sides of his neck. This still persists but is improving. Denies any focal weakness, numbness or tingling. No difficulty speaking or swallowing. No episodes like this in the past. Denies any chronic medications or medical problems. Uses marijuana occasionally but not recently. No other drug use.   The history is provided by the patient.  Abdominal Pain   Pertinent negatives include nausea, vomiting, dysuria, hematuria, arthralgias and myalgias.    History reviewed. No pertinent past medical history.  There are no active problems to display for this patient.   History reviewed. No pertinent surgical history.     Home Medications    Prior to Admission medications   Medication Sig Start Date End Date Taking? Authorizing Provider  ibuprofen (ADVIL,MOTRIN) 800 MG tablet Take 1 tablet (800 mg total) by mouth 3 (three) times daily. 08/20/15  Yes Lily Kocher, PA-C  amoxicillin (AMOXIL) 500 MG capsule Take 1 capsule (500 mg total) by mouth 3 (three) times daily. 07/18/15   Milton Ferguson, MD  DM-Doxylamine-Acetaminophen (NYQUIL COLD & FLU PO) Take 30 mLs by mouth daily as needed (FOR COLD/FLU).    [provider]  doxycycline (VIBRAMYCIN) 100 MG capsule Take 1 capsule (100 mg total) by mouth 2 (two) times daily. 08/20/15   Lily Kocher, PA-C  HYDROcodone-acetaminophen (NORCO/VICODIN) 5-325 MG tablet  Take 1 tablet by mouth every 4 (four) hours as needed. 08/20/15   Lily Kocher, PA-C  nitroGLYCERIN (NITROSTAT) 0.4 MG SL tablet Place 0.4 mg under the tongue every 5 (five) minutes as needed for chest pain.    [provider]  ondansetron (ZOFRAN) 4 MG tablet Take 1 tablet (4 mg total) by mouth every 8 (eight) hours as needed for nausea or vomiting. 02/28/16   Rolland Porter, MD  Pseudoephedrine-DM-GG-APAP (TYLENOL COLD) 30-15-200-325 MG TABS Take 2 capsules by mouth daily as needed (FOR COLD/FLU).    [provider]    Family History Family History  Problem Relation Age of Onset  . Diabetes Mother   . Thyroid disease Mother   . COPD Father     Social History Social History  Substance Use Topics  . Smoking status: Current Every Day Smoker    Packs/day: 0.50    Types: Cigarettes  . Smokeless tobacco: Never Used  . Alcohol use Yes     Comment: occasional     Allergies   Patient has no known allergies.   Review of Systems Review of Systems  Constitutional: Negative for activity change and appetite change.  HENT: Negative for congestion and rhinorrhea.   Eyes: Negative for visual disturbance.  Respiratory: Negative for cough, chest tightness and shortness of breath.   Cardiovascular: Negative for chest pain.  Gastrointestinal: Positive for abdominal pain. Negative for nausea and vomiting.  Genitourinary: Negative for decreased urine volume, dysuria and hematuria.  Musculoskeletal: Negative for arthralgias, myalgias and neck pain.  Skin:  Negative for rash.  Neurological: Positive for numbness. Negative for dizziness.  Hematological: Negative for adenopathy.    all other systems are negative except as noted in the HPI and PMH.    Physical Exam Updated Vital Signs BP 121/90 (BP Location: Right Arm)   Pulse 80   Temp 99 F (37.2 C) (Oral)   Resp (!) 22   Ht 5\' 11"  (1.803 m)   Wt 86.2 kg (190 lb)   SpO2 100%   BMI 26.50 kg/m   Physical Exam    Constitutional: He is oriented to person, place, and time. He appears well-developed and well-nourished. No distress.  HENT:  Head: Normocephalic and atraumatic.  Mouth/Throat: Oropharynx is clear and moist. No oropharyngeal exudate.  Eyes: Conjunctivae and EOM are normal. Pupils are equal, round, and reactive to light.  Neck: Normal range of motion. Neck supple.  No meningismus.  Cardiovascular: Normal rate, regular rhythm, normal heart sounds and intact distal pulses.   No murmur heard. Pulmonary/Chest: Effort normal and breath sounds normal. No respiratory distress.  Abdominal: Soft. There is no tenderness. There is no rebound and no guarding.  Musculoskeletal: Normal range of motion. He exhibits no edema or tenderness.  Neurological: He is alert and oriented to person, place, and time. No cranial nerve deficit. He exhibits normal muscle tone. Coordination normal.  No ataxia on finger to nose bilaterally. No pronator drift. 5/5 strength throughout. CN 2-12 intact.Equal grip strength. Sensation intact.   Skin: Skin is warm.  Psychiatric: He has a normal mood and affect. His behavior is normal.  Nursing note and vitals reviewed.    ED Treatments / Results  Labs (all labs ordered are listed, but only abnormal results are displayed) Labs Reviewed  URINALYSIS, ROUTINE W REFLEX MICROSCOPIC - Abnormal; Notable for the following:       Result Value   APPearance HAZY (*)    All other components within normal limits  COMPREHENSIVE METABOLIC PANEL - Abnormal; Notable for the following:    Sodium 134 (*)    Potassium 3.3 (*)    Calcium 8.8 (*)    All other components within normal limits  RAPID URINE DRUG SCREEN, HOSP PERFORMED - Abnormal; Notable for the following:    Tetrahydrocannabinol POSITIVE (*)    All other components within normal limits  CBC WITH DIFFERENTIAL/PLATELET  LIPASE, BLOOD    EKG  EKG Interpretation None       Radiology No results  found.  Procedures Procedures (including critical care time)  Medications Ordered in ED Medications  dicyclomine (BENTYL) injection 20 mg (not administered)     Initial Impression / Assessment and Plan / ED Course  I have reviewed the triage vital signs and the nursing notes.  Pertinent labs & imaging results that were available during my care of the patient were reviewed by me and considered in my medical decision making (see chart for details).     Acute onset of hunger pains without nausea or vomiting. No abdominal cramping and bilateral tingling to hands and feet.  Distribution not consistent with CVA or TIA. Abdomen is soft and nontender.  May be related to marijuana use.  Labs reassuring. Urinalysis negative. Patient tolerating by mouth in the ED. Paresthesias are improving. Cramps improved with bentyl.  Likely due to marijuana use. Cessation discussed. Return precautions discussed. Follow-up with PCP. Final Clinical Impressions(s) / ED Diagnoses   Final diagnoses:  Abdominal cramps  Marijuana use    New Prescriptions New Prescriptions   No  medications on file     Ezequiel Essex, MD 03/29/17 [phone]

## 2017-03-30 DIAGNOSIS — R112 Nausea with vomiting, unspecified: Secondary | ICD-10-CM | POA: Diagnosis not present

## 2017-03-30 DIAGNOSIS — K859 Acute pancreatitis without necrosis or infection, unspecified: Secondary | ICD-10-CM | POA: Diagnosis not present

## 2017-03-30 DIAGNOSIS — K8500 Idiopathic acute pancreatitis without necrosis or infection: Secondary | ICD-10-CM | POA: Diagnosis not present

## 2017-03-30 DIAGNOSIS — R1030 Lower abdominal pain, unspecified: Secondary | ICD-10-CM | POA: Diagnosis not present

## 2017-03-30 DIAGNOSIS — K820 Obstruction of gallbladder: Secondary | ICD-10-CM | POA: Diagnosis not present

## 2017-03-30 DIAGNOSIS — R748 Abnormal levels of other serum enzymes: Secondary | ICD-10-CM | POA: Diagnosis not present

## 2017-03-30 DIAGNOSIS — R1033 Periumbilical pain: Secondary | ICD-10-CM | POA: Diagnosis not present

## 2017-03-30 DIAGNOSIS — F17210 Nicotine dependence, cigarettes, uncomplicated: Secondary | ICD-10-CM | POA: Diagnosis not present

## 2017-03-30 DIAGNOSIS — R630 Anorexia: Secondary | ICD-10-CM | POA: Diagnosis not present

## 2017-03-30 DIAGNOSIS — R1111 Vomiting without nausea: Secondary | ICD-10-CM | POA: Diagnosis not present

## 2017-04-03 DIAGNOSIS — F17210 Nicotine dependence, cigarettes, uncomplicated: Secondary | ICD-10-CM | POA: Diagnosis not present

## 2017-04-03 DIAGNOSIS — Z719 Counseling, unspecified: Secondary | ICD-10-CM | POA: Diagnosis not present

## 2017-04-03 DIAGNOSIS — Z716 Tobacco abuse counseling: Secondary | ICD-10-CM | POA: Diagnosis not present

## 2017-04-03 DIAGNOSIS — Z008 Encounter for other general examination: Secondary | ICD-10-CM | POA: Diagnosis not present

## 2017-04-03 DIAGNOSIS — Z1389 Encounter for screening for other disorder: Secondary | ICD-10-CM | POA: Diagnosis not present

## 2017-04-03 DIAGNOSIS — K859 Acute pancreatitis without necrosis or infection, unspecified: Secondary | ICD-10-CM | POA: Diagnosis not present

## 2017-04-08 ENCOUNTER — Encounter: Payer: Self-pay | Admitting: Family Medicine

## 2017-04-08 ENCOUNTER — Ambulatory Visit (INDEPENDENT_AMBULATORY_CARE_PROVIDER_SITE_OTHER): Payer: BLUE CROSS/BLUE SHIELD | Admitting: Family Medicine

## 2017-04-08 VITALS — BP 124/82 | Ht 70.5 in | Wt 198.5 lb

## 2017-04-08 DIAGNOSIS — K2900 Acute gastritis without bleeding: Secondary | ICD-10-CM | POA: Diagnosis not present

## 2017-04-08 MED ORDER — PANTOPRAZOLE SODIUM 40 MG PO TBEC: 40.0000 mg | DELAYED_RELEASE_TABLET | Freq: Every day | ORAL | 3 refills | Status: DC

## 2017-04-08 NOTE — Progress Notes (Signed)
   Subjective:    Patient ID: Darryl Reed, male    DOB: [birth year], 31 y.o.   MRN: [id number]  seen in emergency room twice. Both ER reports revieed at length in presence of patient includin tesg result analysis progress notes tc. Abdominal Pain  This is a new problem. The current episode started 1 to 4 weeks ago. The pain is located in the generalized abdominal region.   Patient in today for a ER follow up for pancreatitis. Patient states that pain has improved greatly has a "hunger pain" Pt seen in er twice  Pt had ct scan and told he had pancreatitis  No prob with gall bladder  Pt noted anxiety and tingling and numbness  Pos vom several times   Has been a sig beer drinker   feeling but is doing much better.  States no other concerns this visit.  Just has occas reflux or heartburn  Was seen in e r times two, dxed with "idiopathic panceratitis" in the e r based on mild elv of lipase at 61 and normal ct of abd  Review of Systems  Gastrointestinal: Positive for abdominal pain.  No headache, no major weight loss or weight gain, no chest pain no back pain abdominal pain no change in bowel habits complete ROS otherwise negative      Objective:   Physical Exam Alert and oriented, vitals reviewed and stable, NAD ENT-TM's and ext canals WNL bilat via otoscopic exam Soft palate, tonsils and post pharynx WNL via oropharyngeal exam Neck-symmetric, no masses; thyroid nonpalpable and nontender Pulmonary-no tachypnea or accessory muscle use; Clear without wheezes via auscultation Card--no abnrml murmurs, rhythm reg and rate WNL Carotid pulses symmetric, without bruits  Residual epigastric tenderness noted      Assessment & Plan:  Impression gastritis. I do not think the hospitalization rather ER visit representingpancreatitis. Pancreas enzymes were very slightly elevated only. CT scan unremarkable. Patient does smoke. Also significant anti-inflammatory medication use. Also  daily alcohol use. Encouraged to cut down or stop all of these. Use Tylenol in the short-term. Initiate protonic. Warning signs discussed carefully  Greater than 50% of this 25 minute face to face visit was spent in counseling and discussion and coordination of care regarding the above diagnosis/diagnosies

## 2017-04-08 NOTE — Patient Instructions (Signed)
Gastritis, Adult Gastritis is inflammation of the stomach. There are two kinds of gastritis:  Acute gastritis. This kind develops suddenly.  Chronic gastritis. This kind lasts for a long time.  Gastritis happens when the lining of the stomach becomes weak or gets damaged. Without treatment, gastritis can lead to stomach bleeding and ulcers. What are the causes? This condition may be caused by:  An infection.  Drinking too much alcohol.  Certain medicines.  Having too much acid in the stomach.  A disease of the intestines or stomach.  Stress.  What are the signs or symptoms? Symptoms of this condition include:  Pain or a burning in the upper abdomen.  Nausea.  Vomiting.  An uncomfortable feeling of fullness after eating.  In some cases, there are no symptoms. How is this diagnosed? This condition may be diagnosed with:  A description of your symptoms.  A physical exam.  Tests. These can include: ? Blood tests. ? Stool tests. ? A test in which a thin, flexible instrument with a light and camera on the end is passed down the esophagus and into the stomach (upper endoscopy). ? A test in which a sample of tissue is taken for testing (biopsy).  How is this treated? This condition may be treated with medicines. If the condition is caused by a bacterial infection, you may be given antibiotic medicines. If it is caused by too much acid in the stomach, you may get medicines called H2 blockers, proton pump inhibitors, or antacids. Treatment may also involve stopping the use of certain medicines, such as aspirin, ibuprofen, or other nonsteroidal anti-inflammatory drugs (NSAIDs). Follow these instructions at home:  Take over-the-counter and prescription medicines only as told by your health care provider.  If you were prescribed an antibiotic, take it as told by your health care provider. Do not stop taking the antibiotic even if you start to feel better.  Drink enough  fluid to keep your urine clear or pale yellow.  Eat small, frequent meals instead of large meals. Contact a health care provider if:  Your symptoms get worse.  Your symptoms return after treatment. Get help right away if:  You vomit blood or material that looks like coffee grounds.  You have black or dark red stools.  You are unable to keep fluids down.  Your abdominal pain gets worse.  You have a fever.  You do not feel better after 1 week. This information is not intended to replace advice given to you by your health care provider. Make sure you discuss any questions you have with your health care provider. Document Released: 10/02/2001 Document Revised: 06/06/2016 Document Reviewed: 07/02/2015 Elsevier Interactive Patient Education  2018 [employer]  

## 2017-06-17 DIAGNOSIS — Z716 Tobacco abuse counseling: Secondary | ICD-10-CM | POA: Diagnosis not present

## 2017-06-17 DIAGNOSIS — Z008 Encounter for other general examination: Secondary | ICD-10-CM | POA: Diagnosis not present

## 2017-06-17 DIAGNOSIS — Z719 Counseling, unspecified: Secondary | ICD-10-CM | POA: Diagnosis not present

## 2017-06-17 DIAGNOSIS — F17210 Nicotine dependence, cigarettes, uncomplicated: Secondary | ICD-10-CM | POA: Diagnosis not present

## 2017-08-12 DIAGNOSIS — F17210 Nicotine dependence, cigarettes, uncomplicated: Secondary | ICD-10-CM | POA: Diagnosis not present

## 2017-08-12 DIAGNOSIS — R21 Rash and other nonspecific skin eruption: Secondary | ICD-10-CM | POA: Diagnosis not present

## 2017-08-12 DIAGNOSIS — M791 Myalgia, unspecified site: Secondary | ICD-10-CM | POA: Diagnosis not present

## 2017-08-12 DIAGNOSIS — Z720 Tobacco use: Secondary | ICD-10-CM | POA: Diagnosis not present

## 2017-08-12 DIAGNOSIS — B349 Viral infection, unspecified: Secondary | ICD-10-CM | POA: Diagnosis not present

## 2017-08-12 DIAGNOSIS — R531 Weakness: Secondary | ICD-10-CM | POA: Diagnosis not present

## 2017-08-12 DIAGNOSIS — R079 Chest pain, unspecified: Secondary | ICD-10-CM | POA: Diagnosis not present

## 2017-08-13 ENCOUNTER — Encounter: Payer: Self-pay | Admitting: Family Medicine

## 2017-08-13 ENCOUNTER — Ambulatory Visit (INDEPENDENT_AMBULATORY_CARE_PROVIDER_SITE_OTHER): Payer: BLUE CROSS/BLUE SHIELD | Admitting: Family Medicine

## 2017-08-13 VITALS — BP 138/108 | Temp 99.9°F | Ht 71.0 in | Wt 202.0 lb

## 2017-08-13 DIAGNOSIS — B349 Viral infection, unspecified: Secondary | ICD-10-CM

## 2017-08-13 MED ORDER — DOXYCYCLINE HYCLATE 100 MG PO TABS: 100.0000 mg | ORAL_TABLET | Freq: Two times a day (BID) | ORAL | 0 refills | Status: DC

## 2017-08-13 NOTE — Patient Instructions (Signed)
parainfluenze

## 2017-08-13 NOTE — Progress Notes (Signed)
   Subjective:    Patient ID: Darryl Reed, male    DOB: May 16, 1986, 31 y.o.   MRN: [id number]  HPI Patient here today with body aches and weakness. He reports it started Saturday 08/10/2017 when he woke up with left arm pain as the day went on it never got any better,but then Sunday am pain was then in both arms and in abd feels like he has been doing sit ups. Legs are achy. He says his whole body aches to touch, he even has some groin pain. Does not recall doing strenuous work to cause the aches. Went to the ed in [place] last night after work, ran blood and urine tests and they were negative.Stated he had a viral infection,and gave benedryl,morphene, wrote rx for zofran, and vicoden, has not fill those yet. Now states his throat is even sore.  Sore to the touch  Hit hard this weekend started in the arms  Had in thee muscles and then thru out the chest and back  Low gr fever prn     Headache and cntered in temples  Throat sore  No sickness at school    Review of Systems No headache, no major weight loss or weight gain, no chest pain no back pain abdominal pain no change in bowel habits complete ROS otherwise negative     Objective:   Physical Exam Alert vitals stable, NAD. Blood pressure good on repeat. HEENT normal. Lungs clear. Heart regular rate and rhythm. Mild malaise otherwise within normal limits       Assessment & Plan:  mpression viral syndrome. Completefairly intensive the ER workup revie Likely consistent with viral syndrome. Recommend no further tests at this time. Symptom care discussed warning signs discussed

## 2017-09-04 ENCOUNTER — Encounter (HOSPITAL_COMMUNITY): Payer: Self-pay | Admitting: *Deleted

## 2017-09-04 ENCOUNTER — Emergency Department (HOSPITAL_COMMUNITY): Payer: BLUE CROSS/BLUE SHIELD

## 2017-09-04 ENCOUNTER — Emergency Department (HOSPITAL_COMMUNITY)
Admission: EM | Admit: 2017-09-04 | Discharge: 2017-09-04 | Disposition: A | Discharge status: Home / Self Care | Payer: BLUE CROSS/BLUE SHIELD | Attending: Emergency Medicine | Admitting: Emergency Medicine

## 2017-09-04 DIAGNOSIS — R0600 Dyspnea, unspecified: Secondary | ICD-10-CM | POA: Diagnosis not present

## 2017-09-04 DIAGNOSIS — R079 Chest pain, unspecified: Secondary | ICD-10-CM | POA: Diagnosis not present

## 2017-09-04 DIAGNOSIS — F1910 Other psychoactive substance abuse, uncomplicated: Secondary | ICD-10-CM | POA: Diagnosis not present

## 2017-09-04 DIAGNOSIS — R4182 Altered mental status, unspecified: Secondary | ICD-10-CM | POA: Insufficient documentation

## 2017-09-04 DIAGNOSIS — F22 Delusional disorders: Secondary | ICD-10-CM | POA: Diagnosis not present

## 2017-09-04 DIAGNOSIS — R000 Tachycardia, unspecified: Secondary | ICD-10-CM | POA: Diagnosis not present

## 2017-09-04 DIAGNOSIS — F419 Anxiety disorder, unspecified: Secondary | ICD-10-CM | POA: Diagnosis not present

## 2017-09-04 DIAGNOSIS — F17210 Nicotine dependence, cigarettes, uncomplicated: Secondary | ICD-10-CM | POA: Diagnosis not present

## 2017-09-04 DIAGNOSIS — R002 Palpitations: Secondary | ICD-10-CM | POA: Diagnosis not present

## 2017-09-04 LAB — CBC
HEMATOCRIT: 45.2 % (ref 39.0–52.0)
HEMOGLOBIN: 15.8 g/dL (ref 13.0–17.0)
MCH: 31.8 pg (ref 26.0–34.0)
MCHC: 35 g/dL (ref 30.0–36.0)
MCV: 90.9 fL (ref 78.0–100.0)
Platelets: 285 10*3/uL (ref 150–400)
RBC: 4.97 MIL/uL (ref 4.22–5.81)
RDW: 12.7 % (ref 11.5–15.5)
WBC: 9.4 10*3/uL (ref 4.0–10.5)

## 2017-09-04 LAB — BASIC METABOLIC PANEL
ANION GAP: 11 (ref 5–15)
BUN: 13 mg/dL (ref 6–20)
CHLORIDE: 100 mmol/L — AB (ref 101–111)
CO2: 22 mmol/L (ref 22–32)
Calcium: 9.4 mg/dL (ref 8.9–10.3)
Creatinine, Ser: 0.93 mg/dL (ref 0.61–1.24)
GFR calc Af Amer: >60 mL/min (ref >60)
GLUCOSE: 103 mg/dL — AB (ref 65–99)
POTASSIUM: 2.9 mmol/L — AB (ref 3.5–5.1)
SODIUM: 133 mmol/L — AB (ref 135–145)

## 2017-09-04 LAB — TROPONIN I
TROPONIN I: 0.06 ng/mL — AB (ref <0.03)
Troponin I: 0.06 ng/mL (ref <0.03)

## 2017-09-04 LAB — RAPID URINE DRUG SCREEN, HOSP PERFORMED
AMPHETAMINES: POSITIVE — AB
BARBITURATES: NOT DETECTED
Benzodiazepines: POSITIVE — AB
COCAINE: POSITIVE — AB
OPIATES: NOT DETECTED
TETRAHYDROCANNABINOL: NOT DETECTED

## 2017-09-04 LAB — ETHANOL: Alcohol, Ethyl (B): <10 mg/dL (ref <10)

## 2017-09-04 LAB — MAGNESIUM: Magnesium: 2 mg/dL (ref 1.7–2.4)

## 2017-09-04 MED ORDER — LORAZEPAM 2 MG/ML IJ SOLN
1.0000 mg | Freq: Once | INTRAMUSCULAR | Status: AC
  Administered 2017-09-04: 1 mg via INTRAVENOUS
  Filled 2017-09-04: qty 1

## 2017-09-04 MED ORDER — SODIUM CHLORIDE 0.9 % IV BOLUS (SEPSIS)
1000.0000 mL | Freq: Once | INTRAVENOUS | Status: AC
Start: 2017-09-04 — End: 2017-09-04
  Administered 2017-09-04: 1000 mL via INTRAVENOUS

## 2017-09-04 MED ORDER — LORAZEPAM 2 MG/ML IJ SOLN: 0.0000 mg | Freq: Four times a day (QID) | INTRAMUSCULAR | Status: DC

## 2017-09-04 MED ORDER — LORAZEPAM 1 MG PO TABS: 0.0000 mg | ORAL_TABLET | Freq: Two times a day (BID) | ORAL | Status: DC

## 2017-09-04 MED ORDER — SODIUM CHLORIDE 0.9 % IV BOLUS (SEPSIS)
1000.0000 mL | Freq: Once | INTRAVENOUS | Status: AC
  Administered 2017-09-04: 1000 mL via INTRAVENOUS

## 2017-09-04 MED ORDER — LORAZEPAM 2 MG/ML IJ SOLN: 0.0000 mg | Freq: Two times a day (BID) | INTRAMUSCULAR | Status: DC

## 2017-09-04 MED ORDER — VITAMIN B-1 100 MG PO TABS: 100.0000 mg | ORAL_TABLET | Freq: Every day | ORAL | Status: DC

## 2017-09-04 MED ORDER — LORAZEPAM 1 MG PO TABS
0.0000 mg | ORAL_TABLET | Freq: Four times a day (QID) | ORAL | Status: DC
Start: 2017-09-04 — End: 2017-09-04

## 2017-09-04 MED ORDER — THIAMINE HCL 100 MG/ML IJ SOLN: 100.0000 mg | Freq: Every day | INTRAMUSCULAR | Status: DC

## 2017-09-04 MED ORDER — POTASSIUM CHLORIDE CRYS ER 20 MEQ PO TBCR
40.0000 meq | EXTENDED_RELEASE_TABLET | Freq: Once | ORAL | Status: AC
  Administered 2017-09-04: 40 meq via ORAL
  Filled 2017-09-04: qty 2

## 2017-09-04 NOTE — ED Triage Notes (Signed)
Pt was working today at [employer] when he began having chest pain and diaphoresis. Pt is extremely anxious upon triage. Pt admits to taking 0.5 gram of cocaine on Monday and then 8 adderall over the last 2 days. Pt is alert and oriented.

## 2017-09-04 NOTE — ED Notes (Signed)
Pt gone to xray

## 2017-09-04 NOTE — ED Notes (Signed)
CRITICAL VALUE ALERT  Critical Value:  Trop 0.06  Date & Time Notied:  09/04/17 at 1238  Provider Notified: Thurnell Garbe  Orders Received/Actions taken: EDP made aware

## 2017-09-04 NOTE — BH Assessment (Signed)
Tele Assessment Note   Patient Name: Darryl Reed MRN: [id number] Referring Physician: Francine Graven, DO Location of Patient: [facility] Location of Provider: [place] [facility]  Darryl Reed is an 31 y.o. male presenting to [facility] by EMS after the patient reported to the nurse at work he had heart palpitations and shortness of breath. The patient admittedly ingested cocaine, alcohol and adderall over the last 24 hrs. Reports cocaine use several times a year resulting in paranoid delusions, along with the amphetamines. The patient uses alcohol weekly, 12 pk a week. Originally said he used a few times a month. Also, admits to recent xanax use. The patient denies SI, HI or AVH currently . States general paranoid feeling in the last 24 hrs, "like people were out to get me." but this subsided and patient went to work this morning. The patient reports one visit with a psychiatrist as a teenager. Past trauma hx. No therapy. The patient reports multiple stressors and indicated anxiety, did not indicate depressive symptoms.  The patient lives with his girlfriend and her 91 yr old daughter, as well as, his 54 yr old daughter. The patient denies using drugs in their presence. Has worked the last 5 yrs at [employer], [place] work. The patient was given Ativan while in the ER and appeared groggy. At first, could not offer the month of the year or how he got the ED. But the patient became more alert and answered questions appropriately. Did not appear to respond to internal stimuli or respond in a paranoid manner. The patient did minimize drug and alcohol use. Reports a strong family hx of alcohol use and mental health issues on the paternal side.   ShuVon Rankin, FNP and Dr. Dwyane Dee recommend outpatient services with resources.    Diagnosis: Substance induced mood disorder   Past Medical History: History reviewed. No pertinent past medical history.  History reviewed. No pertinent surgical  history.  Family History:  Family History  Problem Relation Age of Onset  . Diabetes Mother   . Thyroid disease Mother   . COPD Father     Social History:  reports that he has been smoking cigarettes.  He has been smoking about 0.50 packs per day. he has never used smokeless tobacco. He reports that he drinks alcohol. He reports that he uses drugs. Drug: Cocaine.  Additional Social History:  Alcohol / Drug Use Pain Medications: see MAR Prescriptions: see MAR Over the Counter: see MAR History of alcohol / drug use?: Yes Substance #1 Name of Substance 1: alcohol 1 - Age of First Use: UTA 1 - Amount (size/oz): UTA 1 - Frequency:  a few times a month 1 - Duration: UTA 1 - Last Use / Amount: last weekend had 5 beers Substance #2 Name of Substance 2: adderall 2 - Age of First Use: UTA 2 - Amount (size/oz): 20 or 30 mg each x4 2 - Frequency: a few times a year 2 - Duration: UTA 2 - Last Use / Amount: 16 to 18 hours ago Substance #3 Name of Substance 3: cocaine - powder 3 - Age of First Use: UTA 3 - Amount (size/oz): .5 gram 3 - Frequency: x 2 yrly  3 - Duration: UTA 3 - Last Use / Amount: 16 ot 18 hours ago Substance #4 Name of Substance 4: cannabis 4 - Age of First Use: UTA  4 - Amount (size/oz): UTA 4 - Frequency: UTA 4 - Duration: UTA 4 - Last Use / Amount: 8 yrs  ago   CIWA: CIWA-Ar BP: 115/69 Pulse Rate: (!) 107 COWS:    PATIENT STRENGTHS: (choose at least two) Average or above average intelligence General fund of knowledge  Allergies: No Known Allergies  Home Medications:  (Not in a hospital admission)  OB/GYN Status:  No LMP for male patient.  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 : No Suicidal Intent: No Has patient had any suicidal intent within the past 6 months prior to admission? : No Is patient at risk for suicide?: No Suicidal Plan?: No Has patient had any suicidal plan within the past 6 months prior to admission? : No Access to Means: No What has been your use of drugs/alcohol within the last 12 months?: cocaine, adderall, alcohol,  Previous Attempts/Gestures: No How many times?: 0 Other Self Harm Risks: 0 Triggers for Past Attempts: None known Intentional Self Injurious Behavior: None Family Suicide History: Yes(dads side,PGF, and PGGF- shot self) Recent stressful life event(s): Conflict (Comment), Financial Problems Persecutory voices/beliefs?: No Depression: No Substance abuse history and/or treatment for substance abuse?: Yes Suicide prevention information given to non-admitted patients: Not applicable  Risk to Others within the past 6 months Homicidal Ideation: No Does patient have any lifetime risk of violence toward others beyond the six months prior to admission? : No Thoughts of Harm to Others: No Current Homicidal Intent: No Current Homicidal Plan: No Access to Homicidal Means: No Identified  Victim: n/a History of harm to others?: No Assessment of Violence: None Noted Violent Behavior Description: n/a Does patient have access to weapons?: No Criminal Charges Pending?: No Does patient have a court date: No Is patient on probation?: No  Psychosis Hallucinations: None noted Delusions: None noted  Mental Status Report Appearance/Hygiene: Unremarkable Eye Contact: Fair Motor Activity: Freedom of movement Speech: Logical/coherent Level of Consciousness: Alert Mood: Depressed Affect: Depressed Anxiety Level: Severe Thought Processes: Coherent, Relevant Judgement: Partial Orientation: Person, Place, Time, Situation Obsessive Compulsive Thoughts/Behaviors: None  Cognitive Functioning Concentration: Normal Memory: Recent Intact, Remote Intact IQ: Average Insight: Fair Impulse Control: Fair Appetite: Poor(last few days ) Weight Loss: 0 Weight Gain: 0 Sleep: Decreased Total Hours of Sleep: 2 Vegetative Symptoms: None  ADLScreening [facility] Assessment Services) Patient's cognitive ability adequate to safely complete daily activities?: Yes Patient able to express need for assistance with ADLs?: Yes Independently performs ADLs?: Yes (appropriate for developmental age)  Prior Inpatient Therapy Prior Inpatient Therapy: No  Prior Outpatient Therapy Prior Outpatient Therapy: No Does patient have an ACCT team?: No Does patient have Intensive In-House Services?  : No Does patient have [facility] services? : No Does  patient have P4CC services?: No  ADL Screening (condition at time of admission) Patient's cognitive ability adequate to safely complete daily activities?: Yes Patient able to express need for assistance with ADLs?: Yes Independently performs ADLs?: Yes (appropriate for developmental age)             Regulatory affairs officer (For Healthcare) Does Patient Have a Medical Advance Directive?: No Would patient like information on creating a medical advance  directive?: No - Patient declined    Additional Information 1:1 In Past 12 Months?: No CIRT Risk: No Elopement Risk: No Does patient have medical clearance?: Yes     Disposition:  Disposition Initial Assessment Completed for this Encounter: Yes Disposition of Patient: Other dispositions Other disposition(s): Other (Comment)  This service was provided via telemedicine using a 2-way, interactive audio and video technology.     Aileen Pilot Eathon Valade 09/04/2017 4:51 PM

## 2017-09-04 NOTE — ED Notes (Signed)
ED Provider at bedside. 

## 2017-09-04 NOTE — Discharge Instructions (Signed)
Please be sure to contact [facility] recovery services in the morning. Return here for concerning changes in your condition.

## 2017-09-04 NOTE — ED Provider Notes (Signed)
[facility] EMERGENCY DEPARTMENT Provider Note   CSN: [id number] Arrival date & time: 09/04/17  1047     History   Chief Complaint Chief Complaint  Patient presents with  . Chest Pain    HPI Darryl Reed is a 31 y.o. male.   Chest Pain      Pt was seen at 1120.  Per pt and his significant other, c/o gradual onset and worsening of persistent "paranoia" for the past 2 to 3 days. Pt's fiance states pt started drinking etoh 2 days ago, followed by using "a lot of cocaine" and "taking adderall" for the past 2 days. Pt states he "only took 8" adderall, but pt's fiance believes he "probably took even more" to "get high." Pt's fiance states he has been "paranoid" and "thinking things are going to kill him" and "something bad is going to happen." States she gave him "half a valium" approximately 0230 this morning for his symptoms of anxiety, paranoia, chest pain, palpitations. Pt states he "got sweaty" and "had chest pain" when he shortly after getting to work approximately 0745, but pt's wife states his symptoms started overnight. Denies SI/SA, no HI, no hallucinations.    History reviewed. No pertinent past medical history.  There are no active problems to display for this patient.   History reviewed. No pertinent surgical history.     Home Medications    Prior to Admission medications   Medication Sig Start Date End Date Taking? Authorizing Provider  acetaminophen (TYLENOL) 325 MG tablet Take 650 mg every 6 (six) hours as needed by mouth.   Yes [provider]  doxycycline (VIBRA-TABS) 100 MG tablet Take 1 tablet (100 mg total) by mouth 2 (two) times daily. Patient not taking: Reported on 09/04/2017 08/13/17   Mikey Kirschner, MD    Family History Family History  Problem Relation Age of Onset  . Diabetes Mother   . Thyroid disease Mother   . COPD Father     Social History Social History   Tobacco Use  . Smoking status: Current Every Day Smoker   Packs/day: 0.50    Types: Cigarettes  . Smokeless tobacco: Never Used  Substance Use Topics  . Alcohol use: Yes    Comment: occasional  . Drug use: Yes    Types: Cocaine     Allergies   Patient has no known allergies.   Review of Systems Review of Systems  Cardiovascular: Positive for chest pain.  ROS: Statement: All systems negative except as marked or noted in the HPI; Constitutional: Negative for fever and chills. ; ; Eyes: Negative for eye pain, redness and discharge. ; ; ENMT: Negative for ear pain, hoarseness, nasal congestion, sinus pressure and sore throat. ; ; Cardiovascular: Negative for chest pain, palpitations, diaphoresis, dyspnea and peripheral edema. ; ; Respiratory: Negative for cough, wheezing and stridor. ; ; Gastrointestinal: Negative for nausea, vomiting, diarrhea, abdominal pain, blood in stool, hematemesis, jaundice and rectal bleeding. . ; ; Genitourinary: Negative for dysuria, flank pain and hematuria. ; ; Musculoskeletal: Negative for back pain and neck pain. Negative for swelling and trauma.; ; Skin: Negative for pruritus, rash, abrasions, blisters, bruising and skin lesion.; ; Neuro: Negative for headache, lightheadedness and neck stiffness. Negative for weakness, altered level of consciousness, altered mental status, extremity weakness, paresthesias, involuntary movement, seizure and syncope.; Psych:  +paranoia, anxiety. No SI, no SA, no HI, no hallucinations.       Physical Exam Updated Vital Signs BP (!) 144/99  Pulse (!) 108   Temp 99.6 F (37.6 C) (Oral)   Resp 12   Ht 5\' 11"  (1.803 m)   Wt 86.2 kg (190 lb)   SpO2 100%   BMI 26.50 kg/m   Patient Vitals for the past 24 hrs:  BP Temp Temp src Pulse Resp SpO2 Height Weight  09/04/17 1500 129/81 - - - 18 100 % - -  09/04/17 1400 (!) 136/94 - - - 19 - - -  09/04/17 1330 (!) 156/96 - - (!) 119 15 100 % - -  09/04/17 1300 (!) 144/99 - - (!) 108 12 100 % - -  09/04/17 1130 (!) 154/105 - - (!) 119 12  100 % - -  09/04/17 1054 - - - - - - 5\' 11"  (1.803 m) 86.2 kg (190 lb)  09/04/17 1050 (!) 167/104 99.6 F (37.6 C) Oral (!) 118 (!) 24 99 % - -      Physical Exam 1125: Physical examination:  Nursing notes reviewed; Vital signs and O2 SAT reviewed;  Constitutional: Well developed, Well nourished, Well hydrated, In no acute distress; Head:  Normocephalic, atraumatic; Eyes: EOMI, PERRL, No scleral icterus; ENMT: Mouth and pharynx normal, Mucous membranes moist; Neck: Supple, Full range of motion, No lymphadenopathy; Cardiovascular: Tachycardic rate and rhythm, No gallop; Respiratory: Breath sounds clear & equal bilaterally, No wheezes.  Speaking full sentences with ease, Normal respiratory effort/excursion; Chest: Nontender, Movement normal; Abdomen: Soft, Nontender, Nondistended, Normal bowel sounds; Genitourinary: No CVA tenderness; Extremities: Pulses normal, No tenderness, No edema, No calf edema or asymmetry.; Neuro: AA&Ox3, Major CN grossly intact.  Speech clear. No gross focal motor or sensory deficits in extremities.; Skin: Color normal, Warm, Dry.; Psych:  Paranoid, rapid/pressured speech, anxious.     ED Treatments / Results  Labs (all labs ordered are listed, but only abnormal results are displayed)   EKG  EKG Interpretation  Date/Time:  Wednesday September 04 2017 10:48:27 EST Ventricular Rate:  135 PR Interval:    QRS Duration: 83 QT Interval:  302 QTC Calculation: 453 R Axis:   84 Text Interpretation:  Sinus tachycardia When compared with ECG of 02/07/2015 Rate faster Confirmed by Francine Graven [phone]) on 09/04/2017 12:10:06 PM       Radiology   Procedures Procedures (including critical care time)  Medications Ordered in ED Medications  sodium chloride 0.9 % bolus 1,000 mL (0 mLs Intravenous Stopped 09/04/17 1300)  LORazepam (ATIVAN) injection 1 mg (1 mg Intravenous Given 09/04/17 1157)  potassium chloride SA (K-DUR,KLOR-CON) CR tablet 40 mEq (40 mEq Oral  Given 09/04/17 1300)     Initial Impression / Assessment and Plan / ED Course  I have reviewed the triage vital signs and the nursing notes.  Pertinent labs & imaging results that were available during my care of the patient were reviewed by me and considered in my medical decision making (see chart for details).  MDM Reviewed: previous chart, nursing note and vitals Reviewed previous: labs and ECG Interpretation: labs, ECG and x-ray    Results for orders placed or performed during the hospital encounter of 76/81/15  Basic metabolic panel  Result Value Ref Range   Sodium 133 (L) 135 - 145 mmol/L   Potassium 2.9 (L) 3.5 - 5.1 mmol/L   Chloride 100 (L) 101 - 111 mmol/L   CO2 22 22 - 32 mmol/L   Glucose, Bld 103 (H) 65 - 99 mg/dL   BUN 13 6 - 20 mg/dL   Creatinine, Ser 0.93 0.61 -  1.24 mg/dL   Calcium 9.4 8.9 - 10.3 mg/dL   GFR calc non Af Amer >60 >60 mL/min   GFR calc Af Amer >60 >60 mL/min   Anion gap 11 5 - 15  CBC  Result Value Ref Range   WBC 9.4 4.0 - 10.5 K/uL   RBC 4.97 4.22 - 5.81 MIL/uL   Hemoglobin 15.8 13.0 - 17.0 g/dL   HCT 45.2 39.0 - 52.0 %   MCV 90.9 78.0 - 100.0 fL   MCH 31.8 26.0 - 34.0 pg   MCHC 35.0 30.0 - 36.0 g/dL   RDW 12.7 11.5 - 15.5 %   Platelets 285 150 - 400 K/uL  Troponin I  Result Value Ref Range   Troponin I 0.06 (HH) <0.03 ng/mL  Urine rapid drug screen (hosp performed)  Result Value Ref Range   Opiates NONE DETECTED NONE DETECTED   Cocaine POSITIVE (A) NONE DETECTED   Benzodiazepines POSITIVE (A) NONE DETECTED   Amphetamines POSITIVE (A) NONE DETECTED   Tetrahydrocannabinol NONE DETECTED NONE DETECTED   Barbiturates NONE DETECTED NONE DETECTED  Ethanol  Result Value Ref Range   Alcohol, Ethyl (B) <10 <10 mg/dL  Magnesium  Result Value Ref Range   Magnesium 2.0 1.7 - 2.4 mg/dL  Troponin I  Result Value Ref Range   Troponin I 0.06 (HH) <0.03 ng/mL   Dg Chest 2 View Result Date: 09/04/2017 CLINICAL DATA:  Chest pain EXAM:  CHEST  2 VIEW COMPARISON:  02/07/2015 FINDINGS: Normal heart size and mediastinal contours. No acute infiltrate or edema. No effusion or pneumothorax. No osseous findings. IMPRESSION: Negative chest. Electronically Signed   By: Monte Fantasia M.D.   On: 09/04/2017 11:44    1435:  Pt remains anxious, paranoid: IV saline was "took cold and going to kill him," he "doesn't want to fall asleep because he may die," feels "his toes are blue" (they are not). IV ativan given x2 doses as well as IV NS x2L with improvement in tachycardia to 90's. Troponin's elevated but flat, likely due to stimulant abuse over the past few days; doubt ACS with normal EKG, TIMI 0/Heart score 0 and pt's symptoms present at least since overnight last night (possibly even for 2 days). T/C to Cards Dr. Domenic Polite, case discussed, including:  HPI, pertinent PM/SHx, VS/PE, dx testing, ED course and treatment:  He has viewed the EKG and labs, agrees with EDP, mild troponin elevation likely due to stimulant abuse and not ACS, esp with 2 flat troponins, no further workup needed at this time. Will have TTS evaluate.      Final Clinical Impressions(s) / ED Diagnoses   Final diagnoses:  None    ED Discharge Orders    None        Francine Graven, DO 09/04/17 1604

## 2017-09-04 NOTE — ED Notes (Signed)
Lab at bedside

## 2017-09-04 NOTE — BH Assessment (Addendum)
Earleen Newport, FNP and Dr. Dwyane Dee recommend outpatient services with resources. Refer to [facility]

## 2017-09-04 NOTE — ED Notes (Signed)
Family comes out the nurses desk asking about a plan of care for the patient. She states he has been drowsy since the last dose of ativan but doesn't want to fall asleep because he may die.

## 2017-09-04 NOTE — ED Notes (Signed)
Pt continuously states he has an ominous feeling like something bad is going to happen. Pt heart rate is 140s. He is extremely anxious at this time. Encouraged to calm down and take some deep breaths.

## 2017-09-04 NOTE — ED Notes (Signed)
After hooking patient up saline he complained that it was too cold and that is was going to kill him. Pt reassured that the saline was at room temp and it would not kill him. I asked patient 3 times if he would a warm blanket.

## 2017-10-02 DIAGNOSIS — Z719 Counseling, unspecified: Secondary | ICD-10-CM | POA: Diagnosis not present

## 2017-10-02 DIAGNOSIS — F17210 Nicotine dependence, cigarettes, uncomplicated: Secondary | ICD-10-CM | POA: Diagnosis not present

## 2017-10-02 DIAGNOSIS — Z716 Tobacco abuse counseling: Secondary | ICD-10-CM | POA: Diagnosis not present

## 2017-10-30 DIAGNOSIS — Z716 Tobacco abuse counseling: Secondary | ICD-10-CM | POA: Diagnosis not present

## 2017-10-30 DIAGNOSIS — F17210 Nicotine dependence, cigarettes, uncomplicated: Secondary | ICD-10-CM | POA: Diagnosis not present

## 2017-12-04 DIAGNOSIS — Z716 Tobacco abuse counseling: Secondary | ICD-10-CM | POA: Diagnosis not present

## 2017-12-04 DIAGNOSIS — F17210 Nicotine dependence, cigarettes, uncomplicated: Secondary | ICD-10-CM | POA: Diagnosis not present

## 2018-04-16 DIAGNOSIS — Z1389 Encounter for screening for other disorder: Secondary | ICD-10-CM | POA: Diagnosis not present

## 2018-04-16 DIAGNOSIS — Z716 Tobacco abuse counseling: Secondary | ICD-10-CM | POA: Diagnosis not present

## 2018-04-16 DIAGNOSIS — F17210 Nicotine dependence, cigarettes, uncomplicated: Secondary | ICD-10-CM | POA: Diagnosis not present

## 2018-04-16 DIAGNOSIS — Z719 Counseling, unspecified: Secondary | ICD-10-CM | POA: Diagnosis not present

## 2018-10-17 ENCOUNTER — Ambulatory Visit: Payer: BLUE CROSS/BLUE SHIELD | Admitting: Family Medicine

## 2018-12-02 ENCOUNTER — Emergency Department (HOSPITAL_COMMUNITY)
Admission: EM | Admit: 2018-12-02 | Discharge: 2018-12-03 | Disposition: A | Discharge status: Home / Self Care | Payer: BLUE CROSS/BLUE SHIELD | Attending: Emergency Medicine | Admitting: Emergency Medicine

## 2018-12-02 ENCOUNTER — Other Ambulatory Visit: Payer: Self-pay

## 2018-12-02 ENCOUNTER — Encounter (HOSPITAL_COMMUNITY): Payer: Self-pay

## 2018-12-02 DIAGNOSIS — R200 Anesthesia of skin: Secondary | ICD-10-CM | POA: Diagnosis not present

## 2018-12-02 DIAGNOSIS — R51 Headache: Secondary | ICD-10-CM | POA: Diagnosis not present

## 2018-12-02 DIAGNOSIS — Z5321 Procedure and treatment not carried out due to patient leaving prior to being seen by health care provider: Secondary | ICD-10-CM | POA: Insufficient documentation

## 2018-12-02 DIAGNOSIS — R252 Cramp and spasm: Secondary | ICD-10-CM | POA: Diagnosis not present

## 2018-12-02 NOTE — ED Triage Notes (Signed)
Pt reports headache and "hair tingling" x 4 weeks. Pt reports right eye twitching x 1 week. Reports sudden "loud noise" heard in right ear lasting 30-40 seconds, then stopped- pt says, "I thought I could be having a stroke, so I told my wife we needed to go to ED." Pt alert and oriented x 4. NIHSS negative.

## 2018-12-03 ENCOUNTER — Ambulatory Visit: Payer: BLUE CROSS/BLUE SHIELD | Admitting: Family Medicine

## 2018-12-03 ENCOUNTER — Encounter: Payer: Self-pay | Admitting: Family Medicine

## 2018-12-03 VITALS — BP 114/80 | Temp 97.8°F | Ht 71.0 in | Wt 236.8 lb

## 2018-12-03 DIAGNOSIS — H6501 Acute serous otitis media, right ear: Secondary | ICD-10-CM

## 2018-12-03 DIAGNOSIS — G44209 Tension-type headache, unspecified, not intractable: Secondary | ICD-10-CM | POA: Diagnosis not present

## 2018-12-03 DIAGNOSIS — R202 Paresthesia of skin: Secondary | ICD-10-CM

## 2018-12-03 DIAGNOSIS — G245 Blepharospasm: Secondary | ICD-10-CM

## 2018-12-03 MED ORDER — CEFDINIR 300 MG PO CAPS: 300.0000 mg | ORAL_CAPSULE | Freq: Two times a day (BID) | ORAL | 0 refills | Status: AC

## 2018-12-03 MED ORDER — ETODOLAC 400 MG PO TABS: ORAL_TABLET | ORAL | 1 refills | Status: DC

## 2018-12-03 NOTE — ED Notes (Addendum)
Pt sitting on stretcher, using cell phone, no "eye twitching noted" no reports of loud noises in right ear. Informed pt EDP should be in to evaluate him shortly

## 2018-12-03 NOTE — Progress Notes (Signed)
   Subjective:    Patient ID: Darryl Reed, male    DOB: 26-Aug-1986, 33 y.o.   MRN: [id number]  Headache   This is a new problem. Episode onset: 4 weeks. The problem occurs constantly. The pain is located in the frontal region. The quality of the pain is described as sharp. Associated symptoms comments: Intense tingling on top of head, right eye twitch, "wooshing" in right ear. Treatments tried: tylenol, aspirin. The treatment provided mild (helps for about one hour) relief.   Went to [facility] ED yesterday but left before seeing a doctor. Waited for over 3 hours and vitals were not taken.   Left foot numbness that comes and goes. Started about one week ago.   Pt last night had severe wooshing sound in the ear, felt pulsing   Headaches now for about a month, sharp and in the temples, comes and goes, moe nd more frequently, pain varies in severity   Right eye  Twitching at times   Then gets tingly feeling in the scalp   Then has had intermitent numbnes in the left leg  waks a few times per week, fair amnt   Uses tylenol and or aspirin for the headache, but doe ns not knowck it out    Stress level more in general, not especialy so            Working at [employer] last yr, now at [employer] in [place], going well, overall enjos its  Pt got married last may, 52 old son, and has a step daughter    .  Review of Systems  Neurological: Positive for headaches.       Objective:   Physical Exam  Alert and oriented, vitals reviewed and stable, NAD ENT-TM's right TM effusion somewhat cloudy and ext canals WNL bilat via otoscopic exam Soft palate, tonsils and post pharynx WNL via oropharyngeal exam Neck-symmetric, no masses; thyroid nonpalpable and nontender Pulmonary-no tachypnea or accessory muscle use; Clear without wheezes via auscultation Card--no abnrml murmurs, rhythm reg and rate WNL Carotid pulses symmetric, without bruits  Feet bilateral excellent pulses.  Fine  sensation intact.  Gait normal.  Patellar reflexes normal.     Assessment & Plan:  Impression 1 right otitis media discussed antibiotics prescribed  2.  Intermittent paresthesias left foot nonspecific hold off on major work-up  3.  Intermittent headaches.  Tension type and distribution nature.  Lodine prescribed  4.  Twitching right eyelid.  Potentially stress related.  Discussed.  Under quite a bit of increased stress with recent marriage and changing jobs.  Trial of antibiotics and anti-inflammatory meds.  Recheck if symptoms persist  Greater than 50% of this 25 minute face to face visit was spent in counseling and discussion and coordination of care regarding the above diagnosis/diagnosies

## 2019-01-09 ENCOUNTER — Other Ambulatory Visit: Payer: Self-pay | Admitting: Family Medicine

## 2019-01-15 ENCOUNTER — Ambulatory Visit (INDEPENDENT_AMBULATORY_CARE_PROVIDER_SITE_OTHER): Payer: BLUE CROSS/BLUE SHIELD | Admitting: Family Medicine

## 2019-01-15 ENCOUNTER — Telehealth: Payer: Self-pay | Admitting: Family Medicine

## 2019-01-15 ENCOUNTER — Other Ambulatory Visit: Payer: Self-pay

## 2019-01-15 VITALS — Ht 72.0 in

## 2019-01-15 DIAGNOSIS — J019 Acute sinusitis, unspecified: Secondary | ICD-10-CM

## 2019-01-15 DIAGNOSIS — R6889 Other general symptoms and signs: Secondary | ICD-10-CM

## 2019-01-15 MED ORDER — ALBUTEROL SULFATE HFA 108 (90 BASE) MCG/ACT IN AERS: 2.0000 | INHALATION_SPRAY | Freq: Four times a day (QID) | RESPIRATORY_TRACT | 2 refills | Status: DC | PRN

## 2019-01-15 MED ORDER — AZITHROMYCIN 250 MG PO TABS: ORAL_TABLET | ORAL | 0 refills | Status: DC

## 2019-01-15 NOTE — Progress Notes (Signed)
   Subjective:    Patient ID: Darryl Reed, male    DOB: April 29, 1986, 33 y.o.   MRN: [id number]  HPI   Videography fee was unavailable Phone call was done per patient's request to help manage his symptoms 60 minutes spent on phone with patient Patient states he has a productive cough, some tightness in his chest,sinus congestion and diarrhea. He is not running a fever,no trouble breathing ,but he does have some chest wheezing. He states he had these symptoms two weeks ago and got over them, but woke this am with a sore throat.  He has been using Mucinex. has seasonal allergies Coughing at night Has 3 kids with illness Pt with nasty cough sore throat achiness but no fevers No SOB Some wheeze at night  15 minutes spent discussing the case with the patient over the phone.  He was seen back in February with sinus and bronchial but that started to get better but over the past 3 days he has had head congestion drainage sinus pressure but also cough some body aches some diarrhea and some fever.  The patient was told that this is flulike illness and could even be coronavirus.  We did discuss how testing is not recommended unless the patient is needing to be hospitalized.  He denied feeling short of breath denied dizziness passing out  Review of Systems  Constitutional: Negative for activity change.  HENT: Negative for congestion and rhinorrhea.   Respiratory: Negative for cough and shortness of breath.   Cardiovascular: Negative for chest pain.  Gastrointestinal: Negative for abdominal pain, diarrhea, nausea and vomiting.  Genitourinary: Negative for dysuria and hematuria.  Neurological: Negative for weakness and headaches.  Psychiatric/Behavioral: Negative for behavioral problems and confusion.       Objective:          Assessment & Plan:  Acute rhinosinusitis Also acute bronchitis Flulike illness Albuterol was sent in Zithromax sent in Patient was told to use albuterol as  needed he may watch YouTube video to see how to properly use an inhaler Could potentially be coronavirus Patient was cautioned to self isolate He is to stay at home until he is well at least by CDC guidelines which were reviewed with the patient Warning signs were reviewed with patient if significant shortness of breath progressive symptoms or worsening illness immediately needs to go to ER  Message was sent to the front to give the patient a work excuse for this Thursday through next Thursday we can extend the work note if needed all questions were asked and answered by the patient appropriately

## 2019-01-15 NOTE — Telephone Encounter (Signed)
Patient experiencing cough, and headache, patient's wife states that they're childern had been sick and unaware if they could have exposed patient to viral illness. No fever, no sob, no nausea or vomiting.  Also requesting work note for today.    Pharmacy:  [id number] - [place], [place]

## 2019-01-15 NOTE — Telephone Encounter (Signed)
Patient states he has a productive cough, some tightness in his chest,sinus congestion and diarrhea. He is not running a fever,no trouble breathing ,but he does have some chest wheezing. He states he had these symptoms two weeks ago and got over them, but woke this am with a sore throat.  Per Dr.Scott give option to see in tent this evening or do tele visit this afternoon. Patient elected to have a tele visit. He is aware to be by his phone at 2 pm today to speak with Dr.Scott.

## 2019-01-16 ENCOUNTER — Encounter: Payer: Self-pay | Admitting: Family Medicine

## 2019-01-19 NOTE — Addendum Note (Signed)
Addended by: Sallee Lange A on: 01/19/2019 02:04 PM   Modules accepted: Level of Service

## 2019-01-20 ENCOUNTER — Telehealth: Payer: Self-pay | Admitting: Family Medicine

## 2019-01-20 ENCOUNTER — Encounter: Payer: Self-pay | Admitting: Family Medicine

## 2019-01-20 MED ORDER — AMOXICILLIN-POT CLAVULANATE 875-125 MG PO TABS: 1.0000 | ORAL_TABLET | Freq: Two times a day (BID) | ORAL | 0 refills | Status: AC

## 2019-01-20 NOTE — Telephone Encounter (Signed)
Pt is checking checking on first message and also needs an extension on work note

## 2019-01-20 NOTE — Telephone Encounter (Signed)
Aug 875 bid ten d, as I said tpo his bro hard to know with a 069 per cent certainty that his family may not have been exposed to this virus, short of severe sob rec taking the new antibiotic and not going to the er unless very sob

## 2019-01-20 NOTE — Telephone Encounter (Signed)
Prescription sent electronically to pharmacy. Patient notified and needs a new work excuse mailed to him to cover him April 6-8

## 2019-01-20 NOTE — Telephone Encounter (Signed)
Patient was seen 3/26 with cough,congestion and not feeling any better and now hoarse.[facility] [phone] is his number.

## 2019-03-01 ENCOUNTER — Encounter (HOSPITAL_COMMUNITY): Payer: Self-pay

## 2019-03-01 ENCOUNTER — Other Ambulatory Visit: Payer: Self-pay

## 2019-03-01 ENCOUNTER — Emergency Department (HOSPITAL_COMMUNITY)
Admission: EM | Admit: 2019-03-01 | Discharge: 2019-03-01 | Disposition: A | Discharge status: Home / Self Care | Payer: BLUE CROSS/BLUE SHIELD | Attending: Emergency Medicine | Admitting: Emergency Medicine

## 2019-03-01 ENCOUNTER — Emergency Department (HOSPITAL_COMMUNITY): Payer: BLUE CROSS/BLUE SHIELD

## 2019-03-01 DIAGNOSIS — R0602 Shortness of breath: Secondary | ICD-10-CM | POA: Insufficient documentation

## 2019-03-01 DIAGNOSIS — F17210 Nicotine dependence, cigarettes, uncomplicated: Secondary | ICD-10-CM | POA: Insufficient documentation

## 2019-03-01 DIAGNOSIS — R42 Dizziness and giddiness: Secondary | ICD-10-CM | POA: Diagnosis not present

## 2019-03-01 DIAGNOSIS — R079 Chest pain, unspecified: Secondary | ICD-10-CM | POA: Diagnosis not present

## 2019-03-01 DIAGNOSIS — R000 Tachycardia, unspecified: Secondary | ICD-10-CM | POA: Diagnosis not present

## 2019-03-01 LAB — D-DIMER, QUANTITATIVE (NOT AT ARMC): D-Dimer, Quant: <0.27 ug/mL-FEU (ref 0.00–0.50)

## 2019-03-01 LAB — URINALYSIS, ROUTINE W REFLEX MICROSCOPIC
Glucose, UA: 50 mg/dL — AB
Hgb urine dipstick: NEGATIVE
Ketones, ur: NEGATIVE mg/dL
Leukocytes,Ua: NEGATIVE
Nitrite: NEGATIVE
Protein, ur: NEGATIVE mg/dL
Specific Gravity, Urine: 1.019 (ref 1.005–1.030)
pH: 6 (ref 5.0–8.0)

## 2019-03-01 LAB — RAPID URINE DRUG SCREEN, HOSP PERFORMED
Amphetamines: NOT DETECTED
Barbiturates: NOT DETECTED
Benzodiazepines: NOT DETECTED
Cocaine: NOT DETECTED
Opiates: NOT DETECTED
Tetrahydrocannabinol: NOT DETECTED

## 2019-03-01 LAB — CBC
HCT: 40.9 % (ref 39.0–52.0)
Hemoglobin: 13.9 g/dL (ref 13.0–17.0)
MCH: 30.9 pg (ref 26.0–34.0)
MCHC: 34 g/dL (ref 30.0–36.0)
MCV: 90.9 fL (ref 80.0–100.0)
Platelets: 263 10*3/uL (ref 150–400)
RBC: 4.5 MIL/uL (ref 4.22–5.81)
RDW: 12 % (ref 11.5–15.5)
WBC: 8.8 10*3/uL (ref 4.0–10.5)
nRBC: 0 % (ref 0.0–0.2)

## 2019-03-01 LAB — TROPONIN I: Troponin I: <0.03 ng/mL (ref <0.03)

## 2019-03-01 LAB — CBG MONITORING, ED: Glucose-Capillary: 118 mg/dL — ABNORMAL HIGH (ref 70–99)

## 2019-03-01 LAB — BASIC METABOLIC PANEL
Anion gap: 7 (ref 5–15)
BUN: 13 mg/dL (ref 6–20)
CO2: 25 mmol/L (ref 22–32)
Calcium: 8.7 mg/dL — ABNORMAL LOW (ref 8.9–10.3)
Chloride: 108 mmol/L (ref 98–111)
Creatinine, Ser: 0.8 mg/dL (ref 0.61–1.24)
GFR calc Af Amer: >60 mL/min (ref >60)
GFR calc non Af Amer: >60 mL/min (ref >60)
Glucose, Bld: 117 mg/dL — ABNORMAL HIGH (ref 70–99)
Potassium: 3.8 mmol/L (ref 3.5–5.1)
Sodium: 140 mmol/L (ref 135–145)

## 2019-03-01 MED ORDER — ASPIRIN 81 MG PO CHEW
324.0000 mg | CHEWABLE_TABLET | Freq: Once | ORAL | Status: DC
  Filled 2019-03-01: qty 4

## 2019-03-01 NOTE — ED Notes (Signed)
Patient has history of cocaine use, patient denies use in the past 2 years.

## 2019-03-01 NOTE — ED Provider Notes (Signed)
[facility] EMERGENCY DEPARTMENT Provider Note   CSN: [id number] Arrival date & time: 03/01/19  1120    History   Chief Complaint Chief Complaint  Patient presents with   Chest Pain    HPI Darryl Reed is a 33 y.o. male with no chronic medical conditions presenting with sudden onset of lightheadedness, shortness of breath associated with fleeting stabs of chest pain shortly after arriving at work around 10 am. He works in Press photographer at [employer] and was standing when he suddenly developed lightheadedness along with blurred vision and nausea and generalized weakness. He proceeded to his supervisors office, sat down, became nauseated, developed left arm numbness, had emesis x 1 along with 2-3 fleeting midsternal stabbing chest pain.  EMS was called, he was given 4 baby aspirin and reports about 1/2 way here, his symptoms resolved.  His only lingering sx at this time is extreme fatigue.  He denies risk factors CAD, no significant family hx in 1st degree relatives but reports his paternal stepbrother did have bypass surgery around the age of 52.  He denies pain radiating into his back and no abdominal pain during this event.  He used to have a problem with cocaine, reports no use in 2 years.    HPI: A 33 year old patient presents for evaluation of chest pain. Initial onset of pain was less than one hour ago. The patient's chest pain is sharp and is not worse with exertion. The patient complains of nausea. The patient's chest pain is middle- or left-sided, is not well-localized, is not described as heaviness/pressure/tightness and does radiate to the arms/jaw/neck. The patient denies diaphoresis. The patient has smoked in the past 90 days. The patient has no history of stroke, has no history of peripheral artery disease, denies any history of treated diabetes, has no relevant family history of coronary artery disease (first degree relative at less than age 64), is not hypertensive, has no history of  hypercholesterolemia and does not have an elevated BMI (>=30).   The history is provided by the patient.    No past medical history on file.  There are no active problems to display for this patient.   History reviewed. No pertinent surgical history.      Home Medications    Prior to Admission medications   Medication Sig Start Date End Date Taking? Authorizing Provider  acetaminophen (TYLENOL) 325 MG tablet Take 650 mg every 6 (six) hours as needed by mouth.   Yes [provider]  albuterol (PROVENTIL HFA;VENTOLIN HFA) 108 (90 Base) MCG/ACT inhaler Inhale 2 puffs into the lungs every 6 (six) hours as needed for wheezing. 01/15/19  Yes Kathyrn Drown, MD  etodolac (LODINE) 400 MG tablet Take 400 mg by mouth 2 (two) times daily as needed.   Yes [provider]    Family History Family History  Problem Relation Age of Onset   Diabetes Mother    Thyroid disease Mother    COPD Father     Social History Social History   Tobacco Use   Smoking status: Current Every Day Smoker    Packs/day: 0.50    Types: Cigarettes   Smokeless tobacco: Never Used  Substance Use Topics   Alcohol use: Yes    Comment: occasional   Drug use: Not Currently    Types: Cocaine     Allergies   Patient has no known allergies.   Review of Systems Review of Systems  Constitutional: Negative for chills and fever.  HENT: Negative  for congestion and sore throat.   Eyes: Negative.   Respiratory: Positive for shortness of breath. Negative for chest tightness.   Cardiovascular: Positive for chest pain.  Gastrointestinal: Positive for nausea and vomiting. Negative for abdominal pain.  Genitourinary: Negative.   Musculoskeletal: Negative for arthralgias, joint swelling and neck pain.  Skin: Negative.  Negative for rash and wound.  Neurological: Positive for light-headedness. Negative for dizziness, weakness, numbness and headaches.  Psychiatric/Behavioral: Negative.       Physical Exam Updated Vital Signs BP 96/62    Pulse 89    Temp 98.4 F (36.9 C) (Oral)    Resp 14    Ht 6' (1.829 m)    Wt 104.3 kg    SpO2 100%    BMI 31.19 kg/m   Physical Exam Vitals signs and nursing note reviewed.  Constitutional:      Appearance: He is well-developed.  HENT:     Head: Normocephalic and atraumatic.  Eyes:     Conjunctiva/sclera: Conjunctivae normal.  Neck:     Musculoskeletal: Normal range of motion.  Cardiovascular:     Rate and Rhythm: Regular rhythm. Tachycardia present.     Heart sounds: Normal heart sounds.  Pulmonary:     Effort: Pulmonary effort is normal.     Breath sounds: Normal breath sounds. No wheezing, rhonchi or rales.  Abdominal:     General: Bowel sounds are normal.     Palpations: Abdomen is soft.     Tenderness: There is no abdominal tenderness.  Musculoskeletal: Normal range of motion.     Right lower leg: He exhibits no tenderness. No edema.     Left lower leg: He exhibits no tenderness. No edema.  Skin:    General: Skin is warm and dry.  Neurological:     Mental Status: He is alert.      ED Treatments / Results  Labs (all labs ordered are listed, but only abnormal results are displayed) Labs Reviewed  BASIC METABOLIC PANEL - Abnormal; Notable for the following components:      Result Value   Glucose, Bld 117 (*)    Calcium 8.7 (*)    All other components within normal limits  URINALYSIS, ROUTINE W REFLEX MICROSCOPIC - Abnormal; Notable for the following components:   Glucose, UA 50 (*)    Bilirubin Urine SMALL (*)    All other components within normal limits  CBG MONITORING, ED - Abnormal; Notable for the following components:   Glucose-Capillary 118 (*)    All other components within normal limits  TROPONIN I  CBC  D-DIMER, QUANTITATIVE (NOT AT [facility])  RAPID URINE DRUG SCREEN, HOSP PERFORMED    EKG EKG Interpretation  Date/Time:  Sunday Mar 01 2019 11:26:03 EDT Ventricular Rate:  105 PR Interval:     QRS Duration: 103 QT Interval:  329 QTC Calculation: 435 R Axis:   75 Text Interpretation:  Sinus tachycardia Borderline T wave abnormalities Baseline wander in lead(s) V5 Interpretation limited secondary to artifact Confirmed by Fredia Sorrow [phone]) on 03/01/2019 11:36:24 AM   Radiology Dg Chest Portable 1 View  Result Date: 03/01/2019 CLINICAL DATA:  Chest pain. EXAM: PORTABLE CHEST 1 VIEW COMPARISON:  Radiographs of September 04, 2017. FINDINGS: The heart size and mediastinal contours are within normal limits. Both lungs are clear. No pneumothorax or pleural effusion is noted. The visualized skeletal structures are unremarkable. IMPRESSION: No active disease. Electronically Signed   By: Marijo Conception M.D.   On: 03/01/2019 12:29  Procedures Procedures (including critical care time)  Medications Ordered in ED Medications  aspirin chewable tablet 324 mg (324 mg Oral Not Given 03/01/19 1235)     Initial Impression / Assessment and Plan / ED Course  I have reviewed the triage vital signs and the nursing notes.  Pertinent labs & imaging results that were available during my care of the patient were reviewed by me and considered in my medical decision making (see chart for details).     HEAR Score: 3  Pt with lightheadedness, n/v, fleeting cp, now resolved.  Labs, ekg, cxr reassuring, low risk for ACS, but heart score "3", delta troponin recommended.  At time of 3 hour trop draw, apparently pt decided to not undergo the 2nd troponin.  He had left the department prior to my being aware he was gone.  He told RN he was leaving and left.   Final Clinical Impressions(s) / ED Diagnoses   Final diagnoses:  Chest pain, unspecified type    ED Discharge Orders    None       Landis Martins 03/01/19 [place]    Fredia Sorrow, MD 03/05/19 1954

## 2019-03-01 NOTE — ED Triage Notes (Signed)
Patient states that he clocked into work today and immediately had episode of chest pain with nausea, dizziness, blurred vision, and emesis. Patient denies LOC. Patient is asymptomatic at this time other than nausea. States he has never experienced an episode like this before.

## 2019-03-01 NOTE — ED Notes (Addendum)
Patient decided to leave AMA. Did not want to wait for second troponin to be drawn. Pt stated he used his call to ask for assistance but that staff ignored his call.  Pt's call light is working at this time, but we were not alerted that he needed assistance. Checked on pt during hourly roundings and pt was heavily sleeping. Pt stated he is ready to "get out of here". Encouraged pt to stay but patient refused.

## 2019-03-18 ENCOUNTER — Other Ambulatory Visit: Payer: Self-pay | Admitting: Family Medicine

## 2019-09-10 ENCOUNTER — Other Ambulatory Visit: Payer: Self-pay

## 2019-09-10 ENCOUNTER — Ambulatory Visit: Payer: BLUE CROSS/BLUE SHIELD | Admitting: Family Medicine

## 2019-09-10 NOTE — Progress Notes (Unsigned)
   Subjective:    Patient ID: Darryl Reed, male    DOB: [birth year], 33 y.o.   MRN: [id number]  HPI Pt is having nausea, stomach cramping, acid. Pt states he has the same type issue in 2008. Pt believes it may be gastritis. Pt states he has been taking Tums and Pepcid OTC. Pt states that sometimes he can eat but other time he may become nauseous and vomit.  Virtual Visit via Video Note  I connected with Darryl Reed on 09/10/19 at  9:30 AM EST by a video enabled telemedicine application and verified that I am speaking with the correct person using two identifiers.  Location: Patient: home Provider: office   I discussed the limitations of evaluation and management by telemedicine and the availability of in person appointments. The patient expressed understanding and agreed to proceed.  History of Present Illness:    Observations/Objective:   Assessment and Plan:   Follow Up Instructions:    I discussed the assessment and treatment plan with the patient. The patient was provided an opportunity to ask questions and all were answered. The patient agreed with the plan and demonstrated an understanding of the instructions.   The patient was advised to call back or seek an in-person evaluation if the symptoms worsen or if the condition fails to improve as anticipated.  I provided *** minutes of non-face-to-face time during this encounter.   Vicente Males, LPN    Review of Systems     Objective:   Physical Exam        Assessment & Plan:

## 2019-09-14 ENCOUNTER — Encounter: Payer: Self-pay | Admitting: Family Medicine

## 2019-09-14 ENCOUNTER — Ambulatory Visit (INDEPENDENT_AMBULATORY_CARE_PROVIDER_SITE_OTHER): Payer: Self-pay | Admitting: Family Medicine

## 2019-09-14 ENCOUNTER — Other Ambulatory Visit: Payer: Self-pay

## 2019-09-14 DIAGNOSIS — K2900 Acute gastritis without bleeding: Secondary | ICD-10-CM

## 2019-09-14 MED ORDER — ONDANSETRON 4 MG PO TBDP: ORAL_TABLET | ORAL | 0 refills | Status: DC

## 2019-09-14 MED ORDER — PANTOPRAZOLE SODIUM 40 MG PO TBEC: 40.0000 mg | DELAYED_RELEASE_TABLET | Freq: Every morning | ORAL | 2 refills | Status: DC

## 2019-09-14 NOTE — Progress Notes (Signed)
   Subjective:  Audio only  Patient ID: KHA ORBIN, male    DOB: [birth year], 33 y.o.   MRN: [id number]  Gastroesophageal Reflux He complains of abdominal pain and nausea. This is a new problem. Episode onset: 4 days. Treatments tried: otc tums, pepcid. The treatment provided mild relief.   Virtual Visit via Telephone Note  I connected with Darryl Reed on 09/14/19 at  1:10 PM EST by telephone and verified that I am speaking with the correct person using two identifiers.  Location: Patient: home Provider: office   I discussed the limitations, risks, security and privacy concerns of performing an evaluation and management service by telephone and the availability of in person appointments. I also discussed with the patient that there may be a patient responsible charge related to this service. The patient expressed understanding and agreed to proceed.   History of Present Illness:    Observations/Objective:   Assessment and Plan:   Follow Up Instructions:    I discussed the assessment and treatment plan with the patient. The patient was provided an opportunity to ask questions and all were answered. The patient agreed with the plan and demonstrated an understanding of the instructions.   The patient was advised to call back or seek an in-person evaluation if the symptoms worsen or if the condition fails to improve as anticipated.  I provided 71minutes of non-face-to-face time during this encounter.   Cramping and discomfort  Patient notes fairly aggravating upper middle abdomen discomfort.  Also tender to self palpation.  Intermittent nausea.  Worse with meals.  Does . also anti-inflammatory medicine use.  Also history of gastritis in the past   Review of Systems  Gastrointestinal: Positive for abdominal pain and nausea.       Objective:   Physical Exam  Virtual      Assessment & Plan:  Impression probable reflare gastritis.  Plan encouraged to stop smoking.   Add Protonix 40 daily at least for a month +2 months if necessary warning signs discussed symptom care discussed

## 2019-09-14 NOTE — Addendum Note (Signed)
Addended by: Carmelina Noun on: 09/14/2019 01:47 PM   Modules accepted: Orders

## 2019-10-22 ENCOUNTER — Other Ambulatory Visit: Payer: Self-pay | Admitting: *Deleted

## 2019-10-22 MED ORDER — ONDANSETRON 4 MG PO TBDP: ORAL_TABLET | ORAL | 0 refills | Status: DC

## 2019-11-23 ENCOUNTER — Encounter: Payer: Self-pay | Admitting: Family Medicine

## 2020-08-11 DIAGNOSIS — F419 Anxiety disorder, unspecified: Secondary | ICD-10-CM | POA: Diagnosis not present

## 2021-02-05 ENCOUNTER — Emergency Department (HOSPITAL_COMMUNITY)
Admission: EM | Admit: 2021-02-05 | Discharge: 2021-02-05 | Disposition: A | Discharge status: Home / Self Care | Payer: BC Managed Care – PPO | Attending: Emergency Medicine | Admitting: Emergency Medicine

## 2021-02-05 ENCOUNTER — Other Ambulatory Visit: Payer: Self-pay

## 2021-02-05 ENCOUNTER — Encounter (HOSPITAL_COMMUNITY): Payer: Self-pay | Admitting: *Deleted

## 2021-02-05 DIAGNOSIS — S70362A Insect bite (nonvenomous), left thigh, initial encounter: Secondary | ICD-10-CM | POA: Diagnosis not present

## 2021-02-05 DIAGNOSIS — F17210 Nicotine dependence, cigarettes, uncomplicated: Secondary | ICD-10-CM | POA: Diagnosis not present

## 2021-02-05 DIAGNOSIS — W57XXXA Bitten or stung by nonvenomous insect and other nonvenomous arthropods, initial encounter: Secondary | ICD-10-CM | POA: Diagnosis not present

## 2021-02-05 DIAGNOSIS — S79922A Unspecified injury of left thigh, initial encounter: Secondary | ICD-10-CM | POA: Diagnosis present

## 2021-02-05 MED ORDER — PREDNISONE 10 MG PO TABS: 20.0000 mg | ORAL_TABLET | Freq: Every day | ORAL | 0 refills | Status: AC

## 2021-02-05 MED ORDER — PREDNISONE 20 MG PO TABS
20.0000 mg | ORAL_TABLET | Freq: Once | ORAL | Status: AC
  Administered 2021-02-05: 20 mg via ORAL
  Filled 2021-02-05: qty 1

## 2021-02-05 NOTE — ED Provider Notes (Signed)
[facility] EMERGENCY DEPARTMENT Provider Note   CSN: [id number] Arrival date & time: 02/05/21  1736     History Chief Complaint  Patient presents with  . Insect Bite    Darryl Reed is a 35 y.o. male who presents to the ED today with complaint of possible insect bite to left inner thigh.  Patient reports that yesterday he was showering when he noticed that his left inner thigh was itching.  He looked down and noticed what appeared to be an insect bite.  He did not feel anything bite him.  He states that since that time area has gotten more red and more itchy.  He is taken 1 Benadryl tablet without much relief.  He also applied Benadryl cream with some relief of the itching.  He wanted to come to the ED to make sure everything was okay.  He does report he noticed a small amount of clear serosanguineous fluid from the area earlier today as well.  He denies any fevers, chills, body aches, dizziness, lightheadedness, nausea, vomiting, shortness of breath, abdominal pain, wheezing, sore throat, any other associated symptoms.  The history is provided by the patient and medical records.       History reviewed. No pertinent past medical history.  There are no problems to display for this patient.   History reviewed. No pertinent surgical history.     Family History  Problem Relation Age of Onset  . Diabetes Mother   . Thyroid disease Mother   . COPD Father     Social History   Tobacco Use  . Smoking status: Current Every Day Smoker    Packs/day: 0.50    Types: Cigarettes  . Smokeless tobacco: Never Used  Substance Use Topics  . Alcohol use: Yes    Comment: occasional  . Drug use: Not Currently    Types: Cocaine    Home Medications Prior to Admission medications   Medication Sig Start Date End Date Taking? Authorizing Provider  predniSONE (DELTASONE) 10 MG tablet Take 2 tablets (20 mg total) by mouth daily for 5 days. 02/05/21 02/10/21 Yes Amenda Duclos, PA-C   acetaminophen (TYLENOL) 325 MG tablet Take 650 mg every 6 (six) hours as needed by mouth.    [provider]  albuterol (PROVENTIL HFA;VENTOLIN HFA) 108 (90 Base) MCG/ACT inhaler Inhale 2 puffs into the lungs every 6 (six) hours as needed for wheezing. Patient not taking: Reported on 09/10/2019 01/15/19   Kathyrn Drown, MD  etodolac (LODINE) 400 MG tablet TAKE 1 TABLET BY MOUTH TWICE A DAY AS NEEDED FOR HEADACHE Patient not taking: Reported on 09/10/2019 03/19/19   Mikey Kirschner, MD  ondansetron (ZOFRAN ODT) 4 MG disintegrating tablet Take one tablet every 6 hours nausea 10/22/19   Mikey Kirschner, MD  pantoprazole (PROTONIX) 40 MG tablet Take 1 tablet (40 mg total) by mouth every morning. 09/14/19   Mikey Kirschner, MD    Allergies    Patient has no known allergies.  Review of Systems   Review of Systems  Constitutional: Negative for chills and fever.  Respiratory: Negative for shortness of breath and wheezing.   Skin:       + insect bite    Physical Exam Updated Vital Signs BP (!) 145/91 (BP Location: Right Arm)   Pulse (!) 103   Temp 98 F (36.7 C) (Oral)   Resp 18   Ht 5\' 11"  (1.803 m)   Wt 100.2 kg   SpO2 100%  BMI 30.82 kg/m   Physical Exam Vitals and nursing note reviewed.  Constitutional:      Appearance: He is not ill-appearing.  HENT:     Head: Normocephalic and atraumatic.  Eyes:     Conjunctiva/sclera: Conjunctivae normal.  Cardiovascular:     Rate and Rhythm: Normal rate and regular rhythm.     Pulses: Normal pulses.  Pulmonary:     Effort: Pulmonary effort is normal.     Breath sounds: Normal breath sounds. No wheezing, rhonchi or rales.  Skin:    General: Skin is warm and dry.     Coloration: Skin is not jaundiced.     Comments: Small pustule noted to medial left thigh with surrounding area of erythema measuring approximately 2 x 2 cm. No increased warmth. 2 small excoriations noted as well which pt endorses are caused by self  infliction. No drainage.   Neurological:     Mental Status: He is alert.     ED Results / Procedures / Treatments   Labs (all labs ordered are listed, but only abnormal results are displayed) Labs Reviewed - No data to display  EKG None  Radiology No results found.  Procedures Procedures   Medications Ordered in ED Medications  predniSONE (DELTASONE) tablet 20 mg (has no administration in time range)    ED Course  I have reviewed the triage vital signs and the nursing notes.  Pertinent labs & imaging results that were available during my care of the patient were reviewed by me and considered in my medical decision making (see chart for details).    MDM Rules/Calculators/A&P                         35 year old male who presents to the ED today after sustaining a possible insect bite to his left medial thigh that he began noticing yesterday.  Endorses itching to same.  No improvement with oral Benadryl.  On arrival to the ED vitals are stable.  Patient is noted to be tachycardic on arrival however this is dissipated during my exam.  He has a small pustule type lesion noted to his left medial thigh with surrounding erythema measuring 2 x 2 cm.  No induration, fluctuance, increased warmth to the touch.  Not consistent with abscess today.  Suspect allergic response to possible insect bite.  He denies any other symptoms including wheezing, shortness of breath, sore throat, difficulty swallowing.  Have encouraged continuation of antihistamines daily to help with allergic response as well as ice to help with swelling.  Will prescribe short course of prednisone as well.  Patient instructed to follow-up with his PCP this week for further evaluation and to return to the ED for any worsening symptoms including other constellation of symptoms such as body aches, dizziness, lightheadedness, vision changes.  Does not seem consistent with tick at this time. Pt is in agreement with plan and stable  for discharge home.   This note was prepared using Dragon voice recognition software and may include unintentional dictation errors due to the inherent limitations of voice recognition software.    Final Clinical Impression(s) / ED Diagnoses Final diagnoses:  Insect bite of left thigh, initial encounter    Rx / DC Orders ED Discharge Orders         Ordered    predniSONE (DELTASONE) 10 MG tablet  Daily        02/05/21 1816  Discharge Instructions     Please take your printed prescription to a pharmacy of your choosing to have your medication filled. Take as prescribed to help with your allergic reaction to an insect bite.   I would recommend continuing Benadryl daily for the next week to help with the allergic response as well. You can continue applying benadryl ointment to the area. Applying ice can also help with itching.   Please follow up with your PCP regarding your ED visit today and for recheck in 1 week.   Return to the ED for any worsening symptoms       Eustaquio Maize, Hershal Coria 02/05/21 1817    Milton Ferguson, MD 02/07/21 [phone]

## 2021-02-05 NOTE — ED Triage Notes (Addendum)
Pt noted some itching and redness yesterday to left upper thigh.  Starting to drain some per pt.

## 2021-02-05 NOTE — Discharge Instructions (Signed)
Please take your printed prescription to a pharmacy of your choosing to have your medication filled. Take as prescribed to help with your allergic reaction to an insect bite.   I would recommend continuing Benadryl daily for the next week to help with the allergic response as well. You can continue applying benadryl ointment to the area. Applying ice can also help with itching.   Please follow up with your PCP regarding your ED visit today and for recheck in 1 week.   Return to the ED for any worsening symptoms

## 2021-05-04 ENCOUNTER — Encounter: Payer: Self-pay | Admitting: Physician Assistant

## 2021-06-01 ENCOUNTER — Ambulatory Visit: Payer: BC Managed Care – PPO | Admitting: Physician Assistant

## 2021-06-01 ENCOUNTER — Encounter: Payer: Self-pay | Admitting: Physician Assistant

## 2021-06-01 VITALS — BP 138/78 | HR 100 | Ht 71.0 in | Wt 229.2 lb

## 2021-06-01 DIAGNOSIS — Z800 Family history of malignant neoplasm of digestive organs: Secondary | ICD-10-CM | POA: Diagnosis not present

## 2021-06-01 DIAGNOSIS — R1084 Generalized abdominal pain: Secondary | ICD-10-CM

## 2021-06-01 DIAGNOSIS — K921 Melena: Secondary | ICD-10-CM

## 2021-06-01 DIAGNOSIS — R194 Change in bowel habit: Secondary | ICD-10-CM

## 2021-06-01 MED ORDER — SUTAB 1479-225-188 MG PO TABS: 1.0000 | ORAL_TABLET | Freq: Once | ORAL | 0 refills | Status: AC

## 2021-06-01 NOTE — Progress Notes (Signed)
Attending Physician's Attestation   I have reviewed the chart.   I agree with the Advanced Practitioner's note, impression, and recommendations with any updates as below. Agree with next steps being endoscopic evaluation.   Justice Britain, MD [facility] # [id number]

## 2021-06-01 NOTE — Progress Notes (Signed)
Chief Complaint: Abdominal pain, hematochezia and a family history of stomach cancer  HPI:    Darryl Reed is a 35 year old Caucasian male with a past medical history as listed below, who was referred to me by Drue Second I* for a complaint of abdominal pain, hematochezia and a family history of stomach cancer.    04/19/2021 patient seen by his PCP and at that time discussed various GI issues described over the past 2 to 3 weeks he had some upper abdominal cramping and pain in cycle between diarrhea and constipation.  Also described 2 days of maroon-colored stool.  Described a family history of colon cancer in his paternal grandmother.  He was started on dicyclomine 20 mg 3 times daily and sent to [place] for further evaluation of bleeding.    Today, the patient describes that 3 years ago he was seen in the ER for what was diagnosed as "acute gastritis", ever since that time he has had trouble with abdominal pain.  Tells me that it used to come "every few months", and now over the past year or so has become more frequent and almost constant on a daily basis.  He describes severe periumbilical cramping rated as a 10/10 when it occurs with a lot of gurgling and rolling.  This is typically followed by diarrhea, but can also radiate back-and-forth to constipation.  Over the past year has also had multiple episodes of seeing some "dark red" blood when he has a stool.  It would typically "be there for a couple of days" and then will be gone.  Also with occasional rectal sharp pain, not always correlating with bleeding.  He has not had any of that over the past couple of months.  He has been using Dicyclomine 10 mg 3 times daily over the past month and a half and it does seem to help, though occasionally when he takes just 1 in the morning he will still have problems until he takes his one in the afternoon.    Describes family history of a paternal grandfather with stomach cancer.    Denies fever, chills or  weight loss.     Past Medical History:  Diagnosis Date   Anxiety    Arthritis    Depression     Past Surgical History:  Procedure Laterality Date   none      Current Outpatient Medications  Medication Sig Dispense Refill   acetaminophen (TYLENOL) 325 MG tablet Take 650 mg every 6 (six) hours as needed by mouth.     dicyclomine (BENTYL) 20 MG tablet Take 20 mg by mouth in the morning, at noon, and at bedtime.     Vilazodone HCl (VIIBRYD) 10 MG TABS Take 10 mg by mouth daily.     No current facility-administered medications for this visit.    Allergies as of 06/01/2021   (No Known Allergies)    Family History  Problem Relation Age of Onset   Diabetes Mother    Thyroid disease Mother    Cervical cancer Mother        in her early 34s   Irritable bowel syndrome Mother    COPD Father    Prostate cancer Maternal Grandfather    Stomach cancer Paternal Grandmother    Diabetes Paternal Grandmother    Diabetes Paternal Grandfather    Heart disease Paternal Grandfather    Stomach cancer Paternal Uncle    Heart disease Paternal Uncle    Colon cancer Neg Hx  Esophageal cancer Neg Hx    Pancreatic cancer Neg Hx    Liver disease Neg Hx     Social History   Socioeconomic History   Marital status: Single    Spouse name: Not on file   Number of children: Not on file   Years of education: Not on file   Highest education level: Not on file  Occupational History   Not on file  Tobacco Use   Smoking status: Every Day    Packs/day: 0.50    Types: Cigarettes   Smokeless tobacco: Never  Substance and Sexual Activity   Alcohol use: Yes    Comment: occasional   Drug use: Not Currently    Types: Cocaine   Sexual activity: Not on file  Other Topics Concern   Not on file  Social History Narrative   Not on file   Social Determinants of Health   Financial Resource Strain: Not on file  Food Insecurity: Not on file  Transportation Needs: Not on file  Physical Activity:  Not on file  Stress: Not on file  Social Connections: Not on file  Intimate Partner Violence: Not on file    Review of Systems:    Constitutional: No weight loss, fever or chills Skin: No rash Cardiovascular: No chest pain Respiratory: No SOB  Gastrointestinal: See HPI and otherwise negative Genitourinary: No dysuria  Neurological: No headache, dizziness or syncope Musculoskeletal: No new muscle or joint pain Hematologic: No bruising Psychiatric: +anxiety   Physical Exam:  Vital signs: BP 138/78   Pulse 100   Ht '5\' 11"'$  (1.803 m)   Wt 229 lb 3.2 oz (104 kg)   BMI 31.97 kg/m   Constitutional:   Pleasant Caucasian male appears to be in NAD, Well developed, Well nourished, alert and cooperative Head:  Normocephalic and atraumatic. Eyes:   PEERL, EOMI. No icterus. Conjunctiva pink. Ears:  Normal auditory acuity. Neck:  Supple Throat: Oral cavity and pharynx without inflammation, swelling or lesion.  Respiratory: Respirations even and unlabored. Lungs clear to auscultation bilaterally.   No wheezes, crackles, or rhonchi.  Cardiovascular: Normal S1, S2. No MRG. Regular rate and rhythm. No peripheral edema, cyanosis or pallor.  Gastrointestinal:  Soft, nondistended, nontender. No rebound or guarding. Normal bowel sounds. No appreciable masses or hepatomegaly. Rectal:  Not performed.  Msk:  Symmetrical without gross deformities. Without edema, no deformity or joint abnormality.  Neurologic:  Alert and  oriented x4;  grossly normal neurologically.  Skin:   Dry and intact without significant lesions or rashes. Psychiatric: Demonstrates good judgement and reason without abnormal affect or behaviors.  No recent labs/imaging.  Assessment: 1.  Generalized abdominal pain: Mostly periumbilical, but can be anywhere, typically a cramping which leads to diarrhea over the past 3 years; most likely IBS 2.  Hematochezia: Describes "maroon" blood off and on over the past year, also some  occasional rectal pain; consider hemorrhoids versus fissure versus IBD versus other 3.  Family history of gastric cancer: In his paternal grandfather 3. Change in bowel habits  Plan: 1.  We will request any recent labs from PCP. 2.  Scheduled patient for diagnostic EGD and colonoscopy given family history of stomach cancer, patient's abdominal pain, change in bowel habits and hematochezia.  These were scheduled with Dr. Rush Landmark.  Patient provided a detailed list of risks for the procedures and he agrees to proceed.  Dr. Rush Landmark will be patient's primary GI physician from now on. 3.  Continue Dicyclomine 10 mg 3 times  daily.  Discussed that we can titrate this up in the future if it is not working. 4.  Briefly discussed IBS as this is most consistent with patient's symptoms today.  Patient is anxious though and wants to rule out any other causes. 5.  Patient to follow in clinic per recommendations of Dr. Rush Landmark after time of procedures.  Darryl Newer, PA-C [facility] 06/01/2021, 2:29 PM  Cc: Drue Second I*

## 2021-06-01 NOTE — Patient Instructions (Signed)
You have been scheduled for an endoscopy and colonoscopy. Please follow the written instructions given to you at your visit today. Please pick up your prep supplies at the pharmacy within the next 1-3 days. If you use inhalers (even only as needed), please bring them with you on the day of your procedure.  If you are age 34 or older, your body mass index should be between 23-30. Your Body mass index is 31.97 kg/m. If this is out of the aforementioned range listed, please consider follow up with your Primary Care Provider.  If you are age 51 or younger, your body mass index should be between 19-25. Your Body mass index is 31.97 kg/m. If this is out of the aformentioned range listed, please consider follow up with your Primary Care Provider.   __________________________________________________________  The [facility] providers would like to encourage you to use [facility] to communicate with providers for non-urgent requests or questions.  Due to long hold times on the telephone, sending your provider a message by [facility] may be a faster and more efficient way to get a response.  Please allow 48 business hours for a response.  Please remember that this is for non-urgent requests.

## 2021-06-29 ENCOUNTER — Other Ambulatory Visit: Payer: Self-pay

## 2021-06-29 ENCOUNTER — Ambulatory Visit (AMBULATORY_SURGERY_CENTER): Payer: BC Managed Care – PPO | Admitting: Gastroenterology

## 2021-06-29 ENCOUNTER — Encounter: Payer: Self-pay | Admitting: Gastroenterology

## 2021-06-29 VITALS — BP 114/70 | HR 73 | Temp 98.8°F | Resp 17 | Ht 71.0 in | Wt 229.0 lb

## 2021-06-29 DIAGNOSIS — K449 Diaphragmatic hernia without obstruction or gangrene: Secondary | ICD-10-CM | POA: Diagnosis not present

## 2021-06-29 DIAGNOSIS — R194 Change in bowel habit: Secondary | ICD-10-CM | POA: Diagnosis not present

## 2021-06-29 DIAGNOSIS — K2970 Gastritis, unspecified, without bleeding: Secondary | ICD-10-CM | POA: Diagnosis not present

## 2021-06-29 DIAGNOSIS — Z800 Family history of malignant neoplasm of digestive organs: Secondary | ICD-10-CM

## 2021-06-29 DIAGNOSIS — K635 Polyp of colon: Secondary | ICD-10-CM

## 2021-06-29 DIAGNOSIS — D124 Benign neoplasm of descending colon: Secondary | ICD-10-CM

## 2021-06-29 DIAGNOSIS — K2950 Unspecified chronic gastritis without bleeding: Secondary | ICD-10-CM

## 2021-06-29 DIAGNOSIS — R1084 Generalized abdominal pain: Secondary | ICD-10-CM

## 2021-06-29 DIAGNOSIS — K2980 Duodenitis without bleeding: Secondary | ICD-10-CM

## 2021-06-29 DIAGNOSIS — K621 Rectal polyp: Secondary | ICD-10-CM | POA: Diagnosis not present

## 2021-06-29 DIAGNOSIS — K625 Hemorrhage of anus and rectum: Secondary | ICD-10-CM

## 2021-06-29 DIAGNOSIS — D123 Benign neoplasm of transverse colon: Secondary | ICD-10-CM

## 2021-06-29 DIAGNOSIS — D128 Benign neoplasm of rectum: Secondary | ICD-10-CM

## 2021-06-29 DIAGNOSIS — D120 Benign neoplasm of cecum: Secondary | ICD-10-CM

## 2021-06-29 MED ORDER — ESOMEPRAZOLE MAGNESIUM 40 MG PO CPDR
40.0000 mg | DELAYED_RELEASE_CAPSULE | Freq: Two times a day (BID) | ORAL | 2 refills | Status: DC
Start: 2021-06-29 — End: 2024-04-15

## 2021-06-29 MED ORDER — SODIUM CHLORIDE 0.9 % IV SOLN: 500.0000 mL | Freq: Once | INTRAVENOUS | Status: DC

## 2021-06-29 NOTE — Progress Notes (Signed)
GASTROENTEROLOGY PROCEDURE H&P NOTE   Primary Care Physician: System, Provider Not In  HPI: Darryl Reed is a 35 y.o. male who presents for EGD/Colonoscopy for Family history stomach cancer paternal grandfather, upper abdominal pain, diarrhea/constipation, hematochezia/maroon stool.  Past Medical History:  Diagnosis Date   Anxiety    Arthritis    Depression    Past Surgical History:  Procedure Laterality Date   none     Current Outpatient Medications  Medication Sig Dispense Refill   dicyclomine (BENTYL) 20 MG tablet Take 20 mg by mouth in the morning, at noon, and at bedtime.     Vilazodone HCl (VIIBRYD) 10 MG TABS Take 10 mg by mouth daily.     acetaminophen (TYLENOL) 325 MG tablet Take 650 mg every 6 (six) hours as needed by mouth.     Current Facility-Administered Medications  Medication Dose Route Frequency Provider Last Rate Last Admin   0.9 %  sodium chloride infusion  500 mL Intravenous Once Mansouraty, Telford Nab., MD        Current Outpatient Medications:    dicyclomine (BENTYL) 20 MG tablet, Take 20 mg by mouth in the morning, at noon, and at bedtime., Disp: , Rfl:    Vilazodone HCl (VIIBRYD) 10 MG TABS, Take 10 mg by mouth daily., Disp: , Rfl:    acetaminophen (TYLENOL) 325 MG tablet, Take 650 mg every 6 (six) hours as needed by mouth., Disp: , Rfl:   Current Facility-Administered Medications:    0.9 %  sodium chloride infusion, 500 mL, Intravenous, Once, Mansouraty, Telford Nab., MD No Known Allergies Family History  Problem Relation Age of Onset   Diabetes Mother    Thyroid disease Mother    Cervical cancer Mother        in her early 62s   Irritable bowel syndrome Mother    COPD Father    Prostate cancer Maternal Grandfather    Stomach cancer Paternal Grandmother    Diabetes Paternal Grandmother    Diabetes Paternal Grandfather    Heart disease Paternal Grandfather    Stomach cancer Paternal Uncle    Heart disease Paternal Uncle    Colon cancer  Neg Hx    Esophageal cancer Neg Hx    Pancreatic cancer Neg Hx    Liver disease Neg Hx    Social History   Socioeconomic History   Marital status: Single    Spouse name: Not on file   Number of children: Not on file   Years of education: Not on file   Highest education level: Not on file  Occupational History   Not on file  Tobacco Use   Smoking status: Every Day    Packs/day: 0.50    Types: Cigarettes   Smokeless tobacco: Never  Vaping Use   Vaping Use: Never used  Substance and Sexual Activity   Alcohol use: Yes    Comment: occasional   Drug use: Not Currently    Types: Cocaine   Sexual activity: Not on file  Other Topics Concern   Not on file  Social History Narrative   Not on file   Social Determinants of Health   Financial Resource Strain: Not on file  Food Insecurity: Not on file  Transportation Needs: Not on file  Physical Activity: Not on file  Stress: Not on file  Social Connections: Not on file  Intimate Partner Violence: Not on file    Physical Exam: Vital signs in last 24 hours: '@VSRANGES'$ @   GEN: NAD EYE:  Sclerae anicteric ENT: MMM CV: Non-tachycardic GI: Soft, NT/ND NEURO:  Alert & Oriented x 3  Lab Results: No results for input(s): WBC, HGB, HCT, PLT in the last 72 hours. BMET No results for input(s): NA, K, CL, CO2, GLUCOSE, BUN, CREATININE, CALCIUM in the last 72 hours. LFT No results for input(s): PROT, ALBUMIN, AST, ALT, ALKPHOS, BILITOT, BILIDIR, IBILI in the last 72 hours. PT/INR No results for input(s): LABPROT, INR in the last 72 hours.   Impression / Plan: This is a 35 y.o.male who presents for EGD/Colonoscopy for Family history stomach cancer paternal grandfather, upper abdominal pain, diarrhea/constipation, hematochezia/maroon stool.  The risks and benefits of endoscopic evaluation/treatment were discussed with the patient and/or family; these include but are not limited to the risk of perforation, infection, bleeding,  missed lesions, lack of diagnosis, severe illness requiring hospitalization, as well as anesthesia and sedation related illnesses.  The patient's history has been reviewed, patient examined, no change in status, and deemed stable for procedure.  The patient and/or family is agreeable to proceed.    Justice Britain, MD [facility] Advanced Endoscopy Office # [phone]

## 2021-06-29 NOTE — Progress Notes (Signed)
1442 Robinul 0.1 mg IV given due large amount of secretions upon assessment.  MD made aware, vss 

## 2021-06-29 NOTE — Progress Notes (Signed)
Report given to PACU, vss 

## 2021-06-29 NOTE — Op Note (Signed)
[place] Patient Name: Darryl Reed Procedure Date: 06/29/2021 2:39 PM MRN: [id number] Endoscopist: Justice Britain , MD Age: 35 Referring MD:  Date of Birth: Jan 04, 1986 Gender: Male Account #: [id number] Procedure:                Upper GI endoscopy Indications:              Upper abdominal pain, Hematochezia Medicines:                Monitored Anesthesia Care Procedure:                Pre-Anesthesia Assessment:                           - Prior to the procedure, a History and Physical                            was performed, and patient medications and                            allergies were reviewed. The patient's tolerance of                            previous anesthesia was also reviewed. The risks                            and benefits of the procedure and the sedation                            options and risks were discussed with the patient.                            All questions were answered, and informed consent                            was obtained. Prior Anticoagulants: The patient has                            taken no previous anticoagulant or antiplatelet                            agents. ASA Grade Assessment: I - A normal, healthy                            patient. After reviewing the risks and benefits,                            the patient was deemed in satisfactory condition to                            undergo the procedure.                           After obtaining informed consent, the endoscope was  passed under direct vision. Throughout the                            procedure, the patient's blood pressure, pulse, and                            oxygen saturations were monitored continuously. The                            [id number] was introduced through the                            mouth, and advanced to the second part of duodenum.                            The upper GI endoscopy was  accomplished without                            difficulty. The patient tolerated the procedure. Scope In: Scope Out: Findings:                 No gross lesions were noted in the entire esophagus.                           The Z-line was irregular and was found 40 cm from                            the incisors.                           A 1 cm hiatal hernia was present.                           Few non-bleeding superficial gastric ulcers with a                            clean ulcer base (Forrest Class III) were found in                            the gastric body and in the gastric antrum. The                            largest lesion was 7 mm in largest dimension.                           Scattered severe inflammation characterized by                            erosions, erythema, friability, granularity and                            linear erosions was found in the entire examined  stomach. Biopsies were taken with a cold forceps                            for histology and Helicobacter pylori testing.                           Patchy moderate inflammation characterized by                            congestion (edema), erythema and granularity was                            found in the duodenal bulb, in the first portion of                            the duodenum and in the second portion of the                            duodenum. Biopsies were taken with a cold forceps                            for histology to rule out Celiac and Helicobacter                            pylori testing. Complications:            No immediate complications. Estimated Blood Loss:     Estimated blood loss was minimal. Impression:               - No gross lesions in esophagus. Z-line irregular,                            40 cm from the incisors.                           - 1 cm hiatal hernia.                           - Non-bleeding gastric ulcers with a clean ulcer                             base (Forrest Class III). Gastritis. Biopsied.                           - Duodenitis. Biopsied. Recommendation:           - Proceed to scheduled colonoscopy.                           - Observe patient's clinical course.                           - Nexium 40 mg twice daily to be initiated.                           - Observe patient's clinical course.                           -  Await pathology results.                           - Repeat upper endoscopy in 4 months for                            surveillance.                           - The findings and recommendations were discussed                            with the patient.                           - The findings and recommendations were discussed                            with the patient's family. Justice Britain, MD 06/29/2021 3:33:21 PM

## 2021-06-29 NOTE — Progress Notes (Signed)
Vitals-CW  History reviewed. 

## 2021-06-29 NOTE — Progress Notes (Signed)
Called to room to assist during endoscopic procedure.  Patient ID and intended procedure confirmed with present staff. Received instructions for my participation in the procedure from the performing physician.  

## 2021-06-29 NOTE — Op Note (Signed)
[place] Patient Name: Darryl Reed Procedure Date: 06/29/2021 3:03 PM MRN: [id number] Endoscopist: Justice Britain , MD Age: 35 Referring MD:  Date of Birth: April 05, 1986 Gender: Male Account #: [id number] Procedure:                Colonoscopy Indications:              Upper abdominal pain, Hematochezia, Rectal                            bleeding, Change in bowel habits Medicines:                Monitored Anesthesia Care Procedure:                Pre-Anesthesia Assessment:                           - Prior to the procedure, a History and Physical                            was performed, and patient medications and                            allergies were reviewed. The patient's tolerance of                            previous anesthesia was also reviewed. The risks                            and benefits of the procedure and the sedation                            options and risks were discussed with the patient.                            All questions were answered, and informed consent                            was obtained. Prior Anticoagulants: The patient has                            taken no previous anticoagulant or antiplatelet                            agents. ASA Grade Assessment: I - A normal, healthy                            patient. After reviewing the risks and benefits,                            the patient was deemed in satisfactory condition to                            undergo the procedure.  After obtaining informed consent, the colonoscope                            was passed under direct vision. Throughout the                            procedure, the patient's blood pressure, pulse, and                            oxygen saturations were monitored continuously. The                            Olympus [id number]) Colonoscope was                            introduced through the anus and advanced to the 5                             cm into the ileum. The colonoscopy was performed                            without difficulty. The patient tolerated the                            procedure. The quality of the bowel preparation was                            adequate. The terminal ileum, ileocecal valve,                            appendiceal orifice, and rectum were photographed. Scope In: 3:04:01 PM Scope Out: 3:24:35 PM Scope Withdrawal Time: 0 hours 19 minutes 2 seconds  Total Procedure Duration: 0 hours 20 minutes 34 seconds  Findings:                 The digital rectal exam findings include                            hemorrhoids. Pertinent negatives include no                            palpable rectal lesions.                           The terminal ileum and ileocecal valve appeared                            normal.                           Seven sessile polyps were found in the rectum (2),                            descending colon (2), transverse colon (1), hepatic  flexure (1) and cecum (1). The polyps were 2 to 10                            mm in size. These polyps were removed with a cold                            snare. Resection and retrieval were complete.                           Normal mucosa was found in the entire colon.                            Biopsies for histology were taken with a cold                            forceps from the entire colon for evaluation of                            microscopic colitis.                           Non-bleeding non-thrombosed external and internal                            hemorrhoids were found during retroflexion, during                            perianal exam and during digital exam. The                            hemorrhoids were Grade II (internal hemorrhoids                            that prolapse but reduce spontaneously). Complications:            No immediate complications. Estimated Blood  Loss:     Estimated blood loss was minimal. Impression:               - Hemorrhoids found on digital rectal exam.                           - The examined portion of the ileum was normal.                           - Seven 2 to 10 mm polyps in the rectum, in the                            descending colon, in the transverse colon, at the                            hepatic flexure and in the cecum, removed with a                            cold snare.  Resected and retrieved.                           - Normal mucosa in the entire examined colon.                            Biopsied.                           - Non-bleeding non-thrombosed external and internal                            hemorrhoids. Recommendation:           - The patient will be observed post-procedure,                            until all discharge criteria are met.                           - Discharge patient to home.                           - Patient has a contact number available for                            emergencies. The signs and symptoms of potential                            delayed complications were discussed with the                            patient. Return to normal activities tomorrow.                            Written discharge instructions were provided to the                            patient.                           - High fiber diet.                           - Use FiberCon 1-2 tablets PO daily.                           - Await pathology results.                           - Repeat colonoscopy in likely 3 years for                            surveillance due to early history of colon polyp                            development - if adenomas are found.                           -  Preparation H should be used 1-2x daily in                            evening.                           - If rectal bleeding recurs would recommend Anusol                            suppositories.                            - Consideration of hemorrhoidal banding vs                            hemorrhoidal banding + external hemorrhoidal                            therapy can be considered.                           - The findings and recommendations were discussed                            with the patient.                           - The findings and recommendations were discussed                            with the patient's family. Justice Britain, MD 06/29/2021 3:39:45 PM

## 2021-06-29 NOTE — Patient Instructions (Addendum)
Handout given:  gastritis, polyps Resume previous diet Start using fibercon 1-2 tablet daily Start nexium '40mg'$  twice daily -- take with water only and at least 30 min before eating Continue current medications Await pathology results You may use preparation H 1- 2 times daily in evening-   if rectal bleeding recurs, try anusol suppositories  YOU HAD AN ENDOSCOPIC PROCEDURE TODAY AT [facility]:   Refer to the procedure report that was given to you for any specific questions about what was found during the examination.  If the procedure report does not answer your questions, please call your gastroenterologist to clarify.  If you requested that your care partner not be given the details of your procedure findings, then the procedure report has been included in a sealed envelope for you to review at your convenience later.  YOU SHOULD EXPECT: Some feelings of bloating in the abdomen. Passage of more gas than usual.  Walking can help get rid of the air that was put into your GI tract during the procedure and reduce the bloating. If you had a lower endoscopy (such as a colonoscopy or flexible sigmoidoscopy) you may notice spotting of blood in your stool or on the toilet paper. If you underwent a bowel prep for your procedure, you may not have a normal bowel movement for a few days.  Please Note:  You might notice some irritation and congestion in your nose or some drainage.  This is from the oxygen used during your procedure.  There is no need for concern and it should clear up in a day or so.  SYMPTOMS TO REPORT IMMEDIATELY:  Following lower endoscopy (colonoscopy or flexible sigmoidoscopy):  Excessive amounts of blood in the stool  Significant tenderness or worsening of abdominal pains  Swelling of the abdomen that is new, acute  Fever of 100F or higher  Following upper endoscopy (EGD)  Vomiting of blood or coffee ground material  New chest pain or pain under the shoulder  blades  Painful or persistently difficult swallowing  New shortness of breath  Fever of 100F or higher  Black, tarry-looking stools  For urgent or emergent issues, a gastroenterologist can be reached at any hour by calling [phone]. Do not use MyChart messaging for urgent concerns.   DIET:  We do recommend a small meal at first, but then you may proceed to your regular diet.  Drink plenty of fluids but you should avoid alcoholic beverages for 24 hours.  ACTIVITY:  You should plan to take it easy for the rest of today and you should NOT DRIVE or use heavy machinery until tomorrow (because of the sedation medicines used during the test).    FOLLOW UP: Our staff will call the number listed on your records 48-72 hours following your procedure to check on you and address any questions or concerns that you may have regarding the information given to you following your procedure. If we do not reach you, we will leave a message.  We will attempt to reach you two times.  During this call, we will ask if you have developed any symptoms of COVID 19. If you develop any symptoms (ie: fever, flu-like symptoms, shortness of breath, cough etc.) before then, please call [phone].  If you test positive for Covid 19 in the 2 weeks post procedure, please call and report this information to [place].    If any biopsies were taken you will be contacted by phone or by letter within the  next 1-3 weeks.  Please call us at [phone] if you have not heard about the biopsies in 3 weeks.   SIGNATURES/CONFIDENTIALITY: You and/or your care partner have signed paperwork which will be entered into your electronic medical record.  These signatures attest to the fact that that the information above on your After Visit Summary has been reviewed and is understood.  Full responsibility of the confidentiality of this discharge information lies with you and/or your care-partner.

## 2021-07-03 ENCOUNTER — Telehealth: Payer: Self-pay

## 2021-07-03 NOTE — Telephone Encounter (Signed)
No answer, left message to call back later today, B.Rivers Gassmann RN. 

## 2021-07-03 NOTE — Telephone Encounter (Signed)
  Follow up Call-  Call back number 06/29/2021  Post procedure Call Back phone  # [phone]  Permission to leave phone message Yes  Some recent data might be hidden     Patient questions:  Do you have a fever, pain , or abdominal swelling? No. Pain Score  0 *  Have you tolerated food without any problems? Yes.    Have you been able to return to your normal activities? Yes.    Do you have any questions about your discharge instructions: Diet   No. Medications  No. Follow up visit  No.  Do you have questions or concerns about your Care? No.  Actions: * If pain score is 4 or above: No action needed, pain <4.

## 2021-07-06 ENCOUNTER — Encounter: Payer: Self-pay | Admitting: Gastroenterology

## 2022-03-01 ENCOUNTER — Encounter: Payer: Self-pay | Admitting: Gastroenterology

## 2022-03-13 ENCOUNTER — Telehealth: Payer: Self-pay | Admitting: Gastroenterology

## 2022-03-13 NOTE — Telephone Encounter (Signed)
Dr Rush Landmark this pt is due for EGD for surveillance of Non-bleeding gastric ulcers.  Darryl Reed says Darryl Reed is not having any problems at this time and would prefer to wait and have egd/colon in 2025 when Darryl Reed is due for colon.  Please advise

## 2022-03-13 NOTE — Telephone Encounter (Signed)
Patient received recall letter for an upper endoscopy due 10/2021.  His colonoscopy is not due until 06/2024.  Patient states he is not having any issues whatsoever and questions if he still needs to have the EGD done now.  If he does, does he need to come in for an OV or a PV?  Please advise.  Thank you.

## 2022-03-14 NOTE — Telephone Encounter (Signed)
As patient is doing well, I think that is reasonable request.  He should maintain his medications at current dosing.  Probably set him up for an outpatient visit with one of the APP's or myself in the next 2 to 3 months.  Thanks. GM

## 2022-03-14 NOTE — Telephone Encounter (Signed)
Placed a call to the pt to make him aware that he can have endo colon in 2025 as requested.  He also needs to have followup in 2-3 months with extender.    No answer and voicemail not set up. Will try later

## 2022-03-15 NOTE — Telephone Encounter (Signed)
The pt has been advised of the ok to hold off on EGD at this time.  I have added the EGD to the 2025 colon recall.  He will call back in a few months to make a follow up.

## 2022-05-28 NOTE — Telephone Encounter (Signed)
ERROR

## 2024-03-24 ENCOUNTER — Encounter (INDEPENDENT_AMBULATORY_CARE_PROVIDER_SITE_OTHER): Payer: Self-pay | Admitting: *Deleted

## 2024-04-15 ENCOUNTER — Telehealth: Payer: Self-pay

## 2024-04-15 NOTE — Telephone Encounter (Signed)
 Who is your primary care physician: Dr.Kirk Renato  Reasons for the colonoscopy: Hx colon polyps  Have you had a colonoscopy before?  Yes 06-29-2021 Dr.Mansouarty  Do you have family history of colon cancer? no  Previous colonoscopy with polyps removed? yes  Do you have a history colorectal cancer?   no  Are you diabetic? If yes, Type 1 or Type 2?    no  Do you have a prosthetic or mechanical heart valve? no  Do you have a pacemaker/defibrillator?   no  Have you had endocarditis/atrial fibrillation? no  Have you had joint replacement within the last 12 months?  no  Do you tend to be constipated or have to use laxatives? no  Do you have any history of drugs or alchohol?  no  Do you use supplemental oxygen?  no  Have you had a stroke or heart attack within the last 6 months? no  Do you take weight loss medication?  no     Do you take any blood-thinning medications such as: (aspirin , warfarin, Plavix, Aggrenox)  no  If yes we need the name, milligram, dosage and who is prescribing doctor  Current Outpatient Medications on File Prior to Visit  Medication Sig Dispense Refill   acetaminophen  (TYLENOL ) 325 MG tablet Take 650 mg every 6 (six) hours as needed by mouth.     dicyclomine  (BENTYL ) 20 MG tablet Take 20 mg by mouth in the morning, at noon, and at bedtime.     esomeprazole  (NEXIUM ) 40 MG capsule Take 1 capsule (40 mg total) by mouth 2 (two) times daily before a meal. 90 capsule 2   Vilazodone HCl (VIIBRYD) 10 MG TABS Take 10 mg by mouth daily.     No current facility-administered medications on file prior to visit.    No Known Allergies   Pharmacy: [facility] Insurance Name:[employer] [id number]  Best number where you can be reached: [phone]

## 2024-04-30 NOTE — Progress Notes (Signed)
   Subjective:   Patient ID: Darryl Reed is a 38 y.o. male Chief Complaint  Patient presents with  . Sore Throat    Began yesterday  . Fever    99 F to 100 F at night -   . Nasal Congestion    Dry mostly  . Headache  . Generalized Body Aches  . Cough    Comes in spells and different times of day for 5 or so minutes    Sore Throat This is a new problem. The current episode started in the past 7 days. The problem has been gradually worsening. Associated symptoms include chills, congestion, coughing, a fever, headaches, a sore throat and swollen glands. Pertinent negatives include no rash.  Fever  Associated symptoms include congestion, coughing, headaches and a sore throat. Pertinent negatives include no rash.  Headache  Associated symptoms include coughing, a fever, a sore throat and swollen glands.  Cough Associated symptoms include chills, a fever, headaches and a sore throat. Pertinent negatives include no rash.    ROS: Negative unless otherwise noted in HPI.  Objective:   Physical Exam Vitals and nursing note reviewed.  Constitutional:      General: He is not in acute distress. HENT:     Head: Normocephalic.     Right Ear: Tympanic membrane normal.     Left Ear: Tympanic membrane normal.     Nose: Nose normal.     Mouth/Throat:     Mouth: Mucous membranes are moist.     Pharynx: Oropharyngeal exudate and posterior oropharyngeal erythema present.   Eyes:     Pupils: Pupils are equal, round, and reactive to light.    Cardiovascular:     Rate and Rhythm: Normal rate.  Pulmonary:     Effort: Pulmonary effort is normal.     Breath sounds: Normal breath sounds.   Skin:    General: Skin is warm and dry.   Neurological:     Mental Status: He is alert and oriented to person, place, and time.       Assessment and Plan:   Assessment & Plan Throat pain  Orders: .  POCT Rapid Strep A Screen - RN Obtain  Suspected COVID-19 virus  infection  Orders: .  POCT SARS/FLU/RSV - RN OBTAIN  Pharyngitis, unspecified etiology  Orders: .  azithromycin  (ZITHROMAX  Z-PAK) 250 MG tablet; Take 2 tablets (500 mg) on  Day 1,  followed by 1 tablet (250 mg) once daily on Days 2 through 5. .  methylPREDNISolone acetate (DEPO-Medrol) injection 80 mg  URI, acute  Orders: .  methylPREDNISolone acetate (DEPO-Medrol) injection 80 mg     The following portions of the patient's history were reviewed and updated as appropriate: allergies, current medications, past family history, past medical history, past social history, past surgical history and problem list.   PHQ-2 Score: 0  PHQ-9 Score:    Screening complete, no depression identified / no further action needed today   Patient education provided and all questions answered. Instructions provided on when to seek additional or emergent care. Patient verbalized understanding.   Follow-up as Needed

## 2024-05-26 ENCOUNTER — Encounter (INDEPENDENT_AMBULATORY_CARE_PROVIDER_SITE_OTHER): Payer: Self-pay | Admitting: *Deleted

## 2024-05-26 NOTE — Telephone Encounter (Signed)
 LM advising he was established with [facility]. They have him on their recall for 06/2024 for tcs/egd.

## 2024-05-26 NOTE — Telephone Encounter (Signed)
 Pcp advised, referral closed

## 2024-05-26 NOTE — Telephone Encounter (Signed)
 Patient already established with Dr. Wilhelmenia. Please refer back to [facility] unless wanting to transfer care here.

## 2024-05-26 NOTE — Telephone Encounter (Signed)
 LMOVM advising established with [facility] and would need to call them for an appointment as he is already established

## 2024-05-27 ENCOUNTER — Encounter: Payer: Self-pay | Admitting: Gastroenterology

## 2024-06-26 ENCOUNTER — Encounter: Payer: Self-pay | Admitting: Gastroenterology

## 2024-06-26 ENCOUNTER — Ambulatory Visit

## 2024-06-26 VITALS — Ht 71.0 in | Wt 235.0 lb

## 2024-06-26 DIAGNOSIS — K2990 Gastroduodenitis, unspecified, without bleeding: Secondary | ICD-10-CM

## 2024-06-26 DIAGNOSIS — K2970 Gastritis, unspecified, without bleeding: Secondary | ICD-10-CM

## 2024-06-26 DIAGNOSIS — Z860100 Personal history of colon polyps, unspecified: Secondary | ICD-10-CM

## 2024-06-26 MED ORDER — NA SULFATE-K SULFATE-MG SULF 17.5-3.13-1.6 GM/177ML PO SOLN: 1.0000 | Freq: Once | ORAL | 0 refills | Status: AC

## 2024-06-26 NOTE — Progress Notes (Signed)
 No issues known to pt with past sedation with any surgeries or procedures Patient denies ever being told they had issues or difficulty with intubation  No FH of Malignant Hyperthermia Pt is not on diet pills nor GLP-1 medications Pt is not on home 02  Pt is not on blood thinners  Pt states occasional issues (couple times a month) with chronic constipation; takes over-the-counter laxatives to relieve No A fib or A flutter Have any cardiac testing pending--no Pt instructed to use [email or web address] or GoodRx for a price reduction on prep  Ambulates independently

## 2024-07-10 ENCOUNTER — Other Ambulatory Visit (HOSPITAL_COMMUNITY): Payer: Self-pay

## 2024-07-10 ENCOUNTER — Telehealth: Payer: Self-pay

## 2024-07-10 ENCOUNTER — Ambulatory Visit (AMBULATORY_SURGERY_CENTER): Admitting: Gastroenterology

## 2024-07-10 ENCOUNTER — Other Ambulatory Visit: Payer: Self-pay

## 2024-07-10 ENCOUNTER — Encounter: Payer: Self-pay | Admitting: Gastroenterology

## 2024-07-10 VITALS — BP 92/56 | HR 70 | Temp 97.8°F | Resp 10 | Ht 71.0 in | Wt 235.0 lb

## 2024-07-10 DIAGNOSIS — Z860101 Personal history of adenomatous and serrated colon polyps: Secondary | ICD-10-CM

## 2024-07-10 DIAGNOSIS — K640 First degree hemorrhoids: Secondary | ICD-10-CM

## 2024-07-10 DIAGNOSIS — K3189 Other diseases of stomach and duodenum: Secondary | ICD-10-CM

## 2024-07-10 DIAGNOSIS — K644 Residual hemorrhoidal skin tags: Secondary | ICD-10-CM | POA: Diagnosis not present

## 2024-07-10 DIAGNOSIS — K2289 Other specified disease of esophagus: Secondary | ICD-10-CM

## 2024-07-10 DIAGNOSIS — Z860100 Personal history of colon polyps, unspecified: Secondary | ICD-10-CM

## 2024-07-10 DIAGNOSIS — Z1211 Encounter for screening for malignant neoplasm of colon: Secondary | ICD-10-CM | POA: Diagnosis present

## 2024-07-10 DIAGNOSIS — K5730 Diverticulosis of large intestine without perforation or abscess without bleeding: Secondary | ICD-10-CM

## 2024-07-10 DIAGNOSIS — K259 Gastric ulcer, unspecified as acute or chronic, without hemorrhage or perforation: Secondary | ICD-10-CM | POA: Diagnosis not present

## 2024-07-10 DIAGNOSIS — K209 Esophagitis, unspecified without bleeding: Secondary | ICD-10-CM

## 2024-07-10 DIAGNOSIS — K2940 Chronic atrophic gastritis without bleeding: Secondary | ICD-10-CM | POA: Diagnosis not present

## 2024-07-10 DIAGNOSIS — K2970 Gastritis, unspecified, without bleeding: Secondary | ICD-10-CM

## 2024-07-10 DIAGNOSIS — D125 Benign neoplasm of sigmoid colon: Secondary | ICD-10-CM

## 2024-07-10 DIAGNOSIS — K635 Polyp of colon: Secondary | ICD-10-CM

## 2024-07-10 MED ORDER — PANTOPRAZOLE SODIUM 40 MG PO TBEC: 40.0000 mg | DELAYED_RELEASE_TABLET | Freq: Two times a day (BID) | ORAL | 6 refills | Status: AC

## 2024-07-10 MED ORDER — SODIUM CHLORIDE 0.9 % IV SOLN
500.0000 mL | Freq: Once | INTRAVENOUS | Status: DC
Start: 2024-07-10 — End: 2024-07-10

## 2024-07-10 MED ORDER — ESOMEPRAZOLE MAGNESIUM 40 MG PO CPDR: 40.0000 mg | DELAYED_RELEASE_CAPSULE | Freq: Two times a day (BID) | ORAL | 6 refills | Status: DC

## 2024-07-10 NOTE — Telephone Encounter (Signed)
 Protonix  has been sent and pt advised

## 2024-07-10 NOTE — Op Note (Signed)
 [facility] Patient Name: Darryl Reed Procedure Date: 07/10/2024 10:00 AM MRN: [id number] Endoscopist: Aloha Finner , MD, [id number] Age: 38 Referring MD:  Date of Birth: 1986-08-06 Gender: Male Account #: [id number] Procedure:                Upper GI endoscopy Indications:              Follow-up of gastric ulcer Medicines:                Monitored Anesthesia Care Procedure:                Pre-Anesthesia Assessment:                           - Prior to the procedure, a History and Physical                            was performed, and patient medications and                            allergies were reviewed. The patient's tolerance of                            previous anesthesia was also reviewed. The risks                            and benefits of the procedure and the sedation                            options and risks were discussed with the patient.                            All questions were answered, and informed consent                            was obtained. Prior Anticoagulants: The patient has                            taken no anticoagulant or antiplatelet agents                            except for NSAID medication. ASA Grade Assessment:                            I - A normal, healthy patient. After reviewing the                            risks and benefits, the patient was deemed in                            satisfactory condition to undergo the procedure.                           After obtaining informed consent, the endoscope was  passed under direct vision. Throughout the                            procedure, the patient's blood pressure, pulse, and                            oxygen saturations were monitored continuously. The                            Olympus scope [id number] was introduced through the                            mouth, and advanced to the second part of duodenum.                            The  upper GI endoscopy was accomplished without                            difficulty. The patient tolerated the procedure. Scope In: Scope Out: Findings:                 No gross lesions were noted in the proximal                            esophagus and in the mid esophagus.                           LA Grade A (one or more mucosal breaks less than 5                            mm, not extending between tops of 2 mucosal folds)                            esophagitis was found in the distal esophagus.                           The Z-line was irregular and was found 40 cm from                            the incisors.                           One non-bleeding cratered gastric ulcer with a                            clean ulcer base (Forrest Class III) was found in                            the gastric antrum. The lesion was 12 mm in largest                            dimension. Biopsies were taken with a cold forceps  for histology.                           Patchy mildly erythematous mucosa without bleeding                            was found in the entire examined stomach. Biopsies                            were taken with a cold forceps for histology and                            Helicobacter pylori testing.                           No gross lesions were noted in the duodenal bulb,                            in the first portion of the duodenum and in the                            second portion of the duodenum.                           The major papilla was normal. Complications:            No immediate complications. Estimated Blood Loss:     Estimated blood loss was minimal. Impression:               - No gross lesions in the proximal esophagus and in                            the mid esophagus.                           - LA Grade A esophagitis noted distally.                           - Z-line irregular, 40 cm from the incisors.                            - Non-bleeding gastric ulcer with a clean ulcer                            base (Forrest Class III) in the antrum. Biopsied.                           - Erythematous mucosa in the stomach. Biopsied.                           - No gross lesions in the duodenal bulb, in the                            first portion of the duodenum and in the second  portion of the duodenum.                           - Normal major papilla. Recommendation:           - Proceed to scheduled colonoscopy.                           - Nexium  40 mg twice daily.                           - Observe patient's clinical course.                           - Await pathology results.                           - Repeat upper endoscopy in 4 months to check                            healing of gastric ulcer.                           - The findings and recommendations were discussed                            with the patient.                           - The findings and recommendations were discussed                            with the patient's family. Aloha Finner, MD 07/10/2024 10:36:44 AM

## 2024-07-10 NOTE — Progress Notes (Signed)
 Called to room to assist during endoscopic procedure.  Patient ID and intended procedure confirmed with present staff. Received instructions for my participation in the procedure from the performing physician.

## 2024-07-10 NOTE — Progress Notes (Signed)
  Recall for Upper Endoscopy in 4 months placed in epic by S Hines,RN since schedule not out that far

## 2024-07-10 NOTE — Op Note (Signed)
 [facility] Patient Name: Darryl Reed Procedure Date: 07/10/2024 9:43 AM MRN: [id number] Endoscopist: Aloha Finner , MD, [id number] Age: 38 Referring MD:  Date of Birth: 10/23/1985 Gender: Male Account #: [id number] Procedure:                Colonoscopy Indications:              High risk colon cancer surveillance: Personal                            history of sessile serrated colon polyp (less than                            10 mm in size) with no dysplasia, High risk colon                            cancer surveillance: Personal history of sessile                            serrated colon polyp (10 mm or greater in size) Medicines:                Monitored Anesthesia Care Procedure:                Pre-Anesthesia Assessment:                           - Prior to the procedure, a History and Physical                            was performed, and patient medications and                            allergies were reviewed. The patient's tolerance of                            previous anesthesia was also reviewed. The risks                            and benefits of the procedure and the sedation                            options and risks were discussed with the patient.                            All questions were answered, and informed consent                            was obtained. Prior Anticoagulants: The patient has                            taken no anticoagulant or antiplatelet agents                            except for NSAID medication. ASA Grade Assessment:  I - A normal, healthy patient. After reviewing the                            risks and benefits, the patient was deemed in                            satisfactory condition to undergo the procedure.                           After obtaining informed consent, the colonoscope                            was passed under direct vision. Throughout the                             procedure, the patient's blood pressure, pulse, and                            oxygen saturations were monitored continuously. The                            Olympus Scope SN: [id number] was introduced through                            the anus and advanced to the 3 cm into the ileum.                            The colonoscopy was performed without difficulty.                            The patient tolerated the procedure. The quality of                            the bowel preparation was good. The terminal ileum,                            ileocecal valve, appendiceal orifice, and rectum                            were photographed. Scope In: 10:20:10 AM Scope Out: 10:30:53 AM Scope Withdrawal Time: 0 hours 8 minutes 57 seconds  Total Procedure Duration: 0 hours 10 minutes 43 seconds  Findings:                 The digital rectal exam findings include                            hemorrhoids. Pertinent negatives include no                            palpable rectal lesions.                           The terminal ileum and ileocecal valve appeared  normal.                           A 7 mm polyp was found in the sigmoid colon. The                            polyp was sessile. The polyp was removed with a                            cold snare. Resection and retrieval were complete.                           A few small-mouthed diverticula were found in the                            recto-sigmoid colon and sigmoid colon.                           Normal mucosa was found in the entire colon                            otherwise.                           Non-bleeding non-thrombosed internal hemorrhoids                            were found during retroflexion, during perianal                            exam and during digital exam. The hemorrhoids were                            Grade II (internal hemorrhoids that prolapse but                            reduce  spontaneously). Complications:            No immediate complications. Estimated Blood Loss:     Estimated blood loss was minimal. Impression:               - Hemorrhoids found on digital rectal exam.                           - The examined portion of the ileum was normal.                           - One 7 mm polyp in the sigmoid colon, removed with                            a cold snare. Resected and retrieved.                           - Diverticulosis in the recto-sigmoid colon and in  the sigmoid colon.                           - Normal mucosa in the entire examined colon                            otherwise.                           - Non-bleeding non-thrombosed internal hemorrhoids. Recommendation:           - The patient will be observed post-procedure,                            until all discharge criteria are met.                           - Discharge patient to home.                           - Patient has a contact number available for                            emergencies. The signs and symptoms of potential                            delayed complications were discussed with the                            patient. Return to normal activities tomorrow.                            Written discharge instructions were provided to the                            patient.                           - High fiber diet.                           - Use FiberCon 1-2 tablets PO daily.                           - Continue present medications.                           - Await pathology results.                           - Repeat colonoscopy in 3 years for surveillance                            will be recommended no matter pathology, due to                            young age of previously found SSP's and  advanced                            adenomas. If at 3-year all is normal without                            recurrence of any adenomatous or serrated  polyps,                            then 5-year colonoscopy recall can be considered.                           - The findings and recommendations were discussed                            with the patient.                           - The findings and recommendations were discussed                            with the patient's family. Aloha Finner, MD 07/10/2024 10:40:27 AM

## 2024-07-10 NOTE — Patient Instructions (Signed)
 Await biopsy results of gastric ulcer and other stomach biopsies   Take Nexium  40 mg twice a day- best to take 30 minutes before eat or drink in the morning and before evening meal   Repeat upper Endoscopy in 4 months to check gastric ulcer healing    Handouts on colon polyps,diverticulosis,& hemorrhoids given to you today.   Await pathology results on polyps removed   Follow high fiber diet - handout given to you today  Use Fiber Con 1- 2 tablets by mouth daily    Continue previous diet & medications   YOU HAD AN ENDOSCOPIC PROCEDURE TODAY AT THE [facility]:   Refer to the procedure report that was given to you for any specific questions about what was found during the examination.  If the procedure report does not answer your questions, please call your gastroenterologist to clarify.  If you requested that your care partner not be given the details of your procedure findings, then the procedure report has been included in a sealed envelope for you to review at your convenience later.  YOU SHOULD EXPECT: Some feelings of bloating in the abdomen. Passage of more gas than usual.  Walking can help get rid of the air that was put into your GI tract during the procedure and reduce the bloating. If you had a lower endoscopy (such as a colonoscopy or flexible sigmoidoscopy) you may notice spotting of blood in your stool or on the toilet paper. If you underwent a bowel prep for your procedure, you may not have a normal bowel movement for a few days.  Please Note:  You might notice some irritation and congestion in your nose or some drainage.  This is from the oxygen used during your procedure.  There is no need for concern and it should clear up in a day or so.  SYMPTOMS TO REPORT IMMEDIATELY:  Following lower endoscopy (colonoscopy or flexible sigmoidoscopy):  Excessive amounts of blood in the stool  Significant tenderness or worsening of abdominal pains  Swelling of the  abdomen that is new, acute  Fever of 100F or higher  Following upper endoscopy (EGD)  Vomiting of blood or coffee ground material  New chest pain or pain under the shoulder blades  Painful or persistently difficult swallowing  New shortness of breath  Fever of 100F or higher  Black, tarry-looking stools  For urgent or emergent issues, a gastroenterologist can be reached at any hour by calling [phone]. Do not use MyChart messaging for urgent concerns.    DIET:  We do recommend a small meal at first, but then you may proceed to your regular diet.  Drink plenty of fluids but you should avoid alcoholic beverages for 24 hours.  ACTIVITY:  You should plan to take it easy for the rest of today and you should NOT DRIVE or use heavy machinery until tomorrow (because of the sedation medicines used during the test).    FOLLOW UP: Our staff will call the number listed on your records the next business day following your procedure.  We will call around 7:15- 8:00 am to check on you and address any questions or concerns that you may have regarding the information given to you following your procedure. If we do not reach you, we will leave a message.     If any biopsies were taken you will be contacted by phone or by letter within the next 1-3 weeks.  Please call us  at [phone] if you  have not heard about the biopsies in 3 weeks.    SIGNATURES/CONFIDENTIALITY: You and/or your care partner have signed paperwork which will be entered into your electronic medical record.  These signatures attest to the fact that that the information above on your After Visit Summary has been reviewed and is understood.  Full responsibility of the confidentiality of this discharge information lies with you and/or your care-partner.

## 2024-07-10 NOTE — Telephone Encounter (Signed)
 Protonix  40 mg twice daily will be OK. 60/6. Thanks. GM

## 2024-07-10 NOTE — Progress Notes (Signed)
 GASTROENTEROLOGY PROCEDURE H&P NOTE   Primary Care Physician: Renato Dorothey HERO, NP  HPI: ERCEL PEPITONE is a 38 y.o. male who presents for Colonoscopy for surveillance of previous SSPs and Endoscopy for evaluation and followup of previous gastric ulcers.  Past Medical History:  Diagnosis Date   Anxiety    Arthritis    Depression    Past Surgical History:  Procedure Laterality Date   COLONOSCOPY     none     UPPER GASTROINTESTINAL ENDOSCOPY     Current Outpatient Medications  Medication Sig Dispense Refill   ibuprofen  (ADVIL ) 200 MG tablet Take 200 mg by mouth every 6 (six) hours as needed.     No current facility-administered medications for this visit.    Current Outpatient Medications:    ibuprofen  (ADVIL ) 200 MG tablet, Take 200 mg by mouth every 6 (six) hours as needed., Disp: , Rfl:  No Known Allergies Family History  Problem Relation Age of Onset   Diabetes Mother    Thyroid  disease Mother    Cervical cancer Mother        in her early 26s   Irritable bowel syndrome Mother    COPD Father    Stomach cancer Paternal Uncle    Heart disease Paternal Uncle    Prostate cancer Maternal Grandfather    Stomach cancer Paternal Grandmother    Diabetes Paternal Grandmother    Diabetes Paternal Grandfather    Heart disease Paternal Grandfather    Colon cancer Neg Hx    Esophageal cancer Neg Hx    Pancreatic cancer Neg Hx    Liver disease Neg Hx    Rectal cancer Neg Hx    Colon polyps Neg Hx    Social History   Socioeconomic History   Marital status: Single    Spouse name: Not on file   Number of children: Not on file   Years of education: Not on file   Highest education level: Not on file  Occupational History   Not on file  Tobacco Use   Smoking status: Former    Current packs/day: 0.50    Types: Cigarettes   Smokeless tobacco: Never  Vaping Use   Vaping status: Never Used  Substance and Sexual Activity   Alcohol use: Not Currently   Drug use: Not  Currently    Types: Cocaine   Sexual activity: Not on file  Other Topics Concern   Not on file  Social History Narrative   Not on file   Social Drivers of Health   Financial Resource Strain: Not on file  Food Insecurity: No Food Insecurity (04/30/2024)   Received from [facility]   Hunger Vital Sign    Within the past 12 months, you worried that your food would run out before you got the money to buy more.: Never true    Within the past 12 months, the food you bought just didn't last and you didn't have money to get more.: Never true  Transportation Needs: No Transportation Needs (04/30/2024)   Received from [facility]   PRAPARE - Transportation    Lack of Transportation (Medical): No    Lack of Transportation (Non-Medical): No  Physical Activity: Not on file  Stress: Not on file  Social Connections: Unknown (02/28/2022)   Received from [facility]   Social Network    Social Network: Not on file  Intimate Partner Violence: Unknown (01/24/2022)   Received from [facility]   HITS  Physically Hurt: Not on file    Insult or Talk Down To: Not on file    Threaten Physical Harm: Not on file    Scream or Curse: Not on file    Physical Exam: There were no vitals filed for this visit. There is no height or weight on file to calculate BMI. GEN: NAD EYE: Sclerae anicteric ENT: MMM CV: Non-tachycardic GI: Soft, NT/ND NEURO:  Alert & Oriented x 3  Lab Results: No results for input(s): WBC, HGB, HCT, PLT in the last 72 hours. BMET No results for input(s): NA, K, CL, CO2, GLUCOSE, BUN, CREATININE, CALCIUM in the last 72 hours. LFT No results for input(s): PROT, ALBUMIN, AST, ALT, ALKPHOS, BILITOT, BILIDIR, IBILI in the last 72 hours. PT/INR No results for input(s): LABPROT, INR in the last 72 hours.   Impression / Plan: This is a 38 y.o.male who presents for Colonoscopy for surveillance of previous SSPs and Endoscopy for  evaluation and followup of previous gastric ulcers.  The risks and benefits of endoscopic evaluation/treatment were discussed with the patient and/or family; these include but are not limited to the risk of perforation, infection, bleeding, missed lesions, lack of diagnosis, severe illness requiring hospitalization, as well as anesthesia and sedation related illnesses.  The patient's history has been reviewed, patient examined, no change in status, and deemed stable for procedure.  The patient and/or family is agreeable to proceed.    Aloha Finner, MD [facility] Advanced Endoscopy Office # [phone]

## 2024-07-10 NOTE — Progress Notes (Signed)
 Pt's states no medical or surgical changes since previsit or office visit.

## 2024-07-10 NOTE — Telephone Encounter (Signed)
 Pharmacy Patient Advocate Encounter   Received notification from CoverMyMeds that prior authorization for Esomeprazole  Magnesium  40MG  dr capsules is required/requested.   Insurance verification completed.   The patient is insured through [facility] .   Prior Authorization form/request asks a question that requires your assistance. Please see the question below and advise accordingly. The PA will not be submitted until the necessary information is received.

## 2024-07-10 NOTE — Progress Notes (Signed)
 A/o x 3, VSS, good SR's, pleased with anesthesia, report to RN

## 2024-07-10 NOTE — Telephone Encounter (Signed)
 Dr Wilhelmenia the insurance will not cover esomeprazole  without failure of omeprazole, protonix  and Aciphex.  Please advise

## 2024-07-13 ENCOUNTER — Telehealth: Payer: Self-pay

## 2024-07-13 NOTE — Telephone Encounter (Signed)
 No answer on follow-up call. Left VM for pt.

## 2024-07-14 ENCOUNTER — Ambulatory Visit: Payer: Self-pay | Admitting: Gastroenterology

## 2024-07-14 LAB — SURGICAL PATHOLOGY
